# Patient Record
Sex: Male | Born: 1950
Health system: Southern US, Community
[De-identification: ages and names within clinical notes are randomized; demographics above are authoritative.]

## PROBLEM LIST (undated history)

## (undated) DIAGNOSIS — N200 Calculus of kidney: Secondary | ICD-10-CM

## (undated) DIAGNOSIS — K5792 Diverticulitis of intestine, part unspecified, without perforation or abscess without bleeding: Secondary | ICD-10-CM

## (undated) DIAGNOSIS — I10 Essential (primary) hypertension: Secondary | ICD-10-CM

## (undated) DIAGNOSIS — I2510 Atherosclerotic heart disease of native coronary artery without angina pectoris: Secondary | ICD-10-CM

## (undated) DIAGNOSIS — E785 Hyperlipidemia, unspecified: Secondary | ICD-10-CM

## (undated) HISTORY — DX: Hyperlipidemia, unspecified: E78.5

## (undated) HISTORY — DX: Diverticulitis of intestine, part unspecified, without perforation or abscess without bleeding: K57.92

## (undated) HISTORY — PX: APPENDECTOMY: SHX54

## (undated) HISTORY — DX: Calculus of kidney: N20.0

## (undated) HISTORY — DX: Atherosclerotic heart disease of native coronary artery without angina pectoris: I25.10

## (undated) HISTORY — DX: Essential (primary) hypertension: I10

---

## 2003-02-15 HISTORY — PX: OTHER SURGICAL HISTORY: SHX169

## 2003-05-16 ENCOUNTER — Ambulatory Visit (HOSPITAL_COMMUNITY): Admission: RE | Admit: 2003-05-16 | Discharge: 2003-05-16 | Payer: Self-pay | Admitting: Family Medicine

## 2003-08-04 ENCOUNTER — Ambulatory Visit (HOSPITAL_COMMUNITY): Admission: RE | Admit: 2003-08-04 | Discharge: 2003-08-04 | Payer: Self-pay | Admitting: Internal Medicine

## 2009-06-03 ENCOUNTER — Ambulatory Visit (HOSPITAL_COMMUNITY): Admission: RE | Admit: 2009-06-03 | Discharge: 2009-06-03 | Payer: Self-pay | Admitting: Family Medicine

## 2009-09-05 ENCOUNTER — Encounter: Payer: Self-pay | Admitting: Cardiovascular Disease

## 2009-12-25 ENCOUNTER — Encounter: Payer: Self-pay | Admitting: Cardiovascular Disease

## 2010-01-12 ENCOUNTER — Encounter: Payer: Self-pay | Admitting: Cardiovascular Disease

## 2010-03-27 ENCOUNTER — Encounter: Payer: Self-pay | Admitting: Cardiovascular Disease

## 2010-04-15 HISTORY — PX: DOBUTAMINE STRESS ECHO: SHX5426

## 2010-04-16 ENCOUNTER — Emergency Department (HOSPITAL_COMMUNITY): Payer: 59

## 2010-04-16 ENCOUNTER — Encounter: Payer: Self-pay | Admitting: Cardiovascular Disease

## 2010-04-16 ENCOUNTER — Emergency Department (HOSPITAL_COMMUNITY)
Admission: EM | Admit: 2010-04-16 | Discharge: 2010-04-16 | Disposition: A | Discharge status: Home / Self Care | Payer: 59 | Attending: Emergency Medicine | Admitting: Emergency Medicine

## 2010-04-16 DIAGNOSIS — N200 Calculus of kidney: Secondary | ICD-10-CM | POA: Insufficient documentation

## 2010-04-16 DIAGNOSIS — R109 Unspecified abdominal pain: Secondary | ICD-10-CM | POA: Insufficient documentation

## 2010-04-16 DIAGNOSIS — I10 Essential (primary) hypertension: Secondary | ICD-10-CM | POA: Insufficient documentation

## 2010-04-16 DIAGNOSIS — Z79899 Other long term (current) drug therapy: Secondary | ICD-10-CM | POA: Insufficient documentation

## 2010-04-16 DIAGNOSIS — R112 Nausea with vomiting, unspecified: Secondary | ICD-10-CM | POA: Insufficient documentation

## 2010-04-16 LAB — COMPREHENSIVE METABOLIC PANEL
ALT: 35 U/L (ref 0–53)
AST: 36 U/L (ref 0–37)
Albumin: 4.1 g/dL (ref 3.5–5.2)
Calcium: 9.6 mg/dL (ref 8.4–10.5)
Chloride: 102 mEq/L (ref 96–112)
GFR calc Af Amer: 51 mL/min — ABNORMAL LOW (ref >60)
GFR calc non Af Amer: 42 mL/min — ABNORMAL LOW (ref >60)
Glucose, Bld: 129 mg/dL — ABNORMAL HIGH (ref 70–99)
Potassium: 4.5 mEq/L (ref 3.5–5.1)
Total Bilirubin: 0.6 mg/dL (ref 0.3–1.2)

## 2010-04-16 LAB — URINALYSIS, ROUTINE W REFLEX MICROSCOPIC
Ketones, ur: NEGATIVE mg/dL
Nitrite: NEGATIVE
Specific Gravity, Urine: 1.025 (ref 1.005–1.030)
pH: 6 (ref 5.0–8.0)

## 2010-04-16 LAB — CBC
MCH: 32 pg (ref 26.0–34.0)
MCHC: 35.6 g/dL (ref 30.0–36.0)
Platelets: 131 10*3/uL — ABNORMAL LOW (ref 150–400)
RDW: 12.6 % (ref 11.5–15.5)

## 2010-04-16 LAB — DIFFERENTIAL
Eosinophils Relative: 0 % (ref 0–5)
Lymphocytes Relative: 8 % — ABNORMAL LOW (ref 12–46)
Monocytes Absolute: 0.3 10*3/uL (ref 0.1–1.0)
Neutro Abs: 8 10*3/uL — ABNORMAL HIGH (ref 1.7–7.7)
Neutrophils Relative %: 88 % — ABNORMAL HIGH (ref 43–77)

## 2010-04-16 LAB — URINE MICROSCOPIC-ADD ON

## 2010-04-21 ENCOUNTER — Encounter: Payer: Self-pay | Admitting: Cardiovascular Disease

## 2010-05-04 ENCOUNTER — Ambulatory Visit: Payer: 59 | Admitting: Cardiology

## 2010-05-06 ENCOUNTER — Encounter: Payer: Self-pay | Admitting: *Deleted

## 2010-05-06 ENCOUNTER — Encounter: Payer: Self-pay | Admitting: Cardiovascular Disease

## 2010-05-07 ENCOUNTER — Other Ambulatory Visit: Payer: Self-pay | Admitting: Cardiovascular Disease

## 2010-05-07 ENCOUNTER — Ambulatory Visit (INDEPENDENT_AMBULATORY_CARE_PROVIDER_SITE_OTHER): Payer: 59 | Admitting: Cardiovascular Disease

## 2010-05-07 ENCOUNTER — Encounter: Payer: Self-pay | Admitting: Cardiovascular Disease

## 2010-05-07 DIAGNOSIS — E785 Hyperlipidemia, unspecified: Secondary | ICD-10-CM

## 2010-05-07 DIAGNOSIS — I10 Essential (primary) hypertension: Secondary | ICD-10-CM

## 2010-05-07 DIAGNOSIS — R0600 Dyspnea, unspecified: Secondary | ICD-10-CM | POA: Insufficient documentation

## 2010-05-07 DIAGNOSIS — R0602 Shortness of breath: Secondary | ICD-10-CM

## 2010-05-07 NOTE — Patient Instructions (Addendum)
Stress Echo Follow up after test above.   Office will call with appointment.

## 2010-05-07 NOTE — Progress Notes (Signed)
HPI  This is a 60 y/o male without any prior cardiac history who was recently found to have coronary calcifications noted on a CT scan which was done for kidney stones. He has no chest pain but does complain of dyspnea with moderate activities. No palpitations, orthopnea or PND. No syncope or presyncope. He doesn't exercise regularly. He does have hyperlipidemia but didn't tolerate Simvastatin before due to myalgia and weakness.  He seems to have white coat hypertension as well as noted on his home BP readings vs. Office readings.   Allergies  Allergen Reactions  . Sulfa Drugs Cross Reactors     Swelling in groin area  . Simvastatin (Fd&C Red #40 [place]-Simvastatin)     myalgia     No current outpatient prescriptions on file prior to visit.     Past Medical History  Diagnosis Date  . Coronary atherosclerosis of native coronary artery   . Hyperlipidemia   . Allergic rhinitis   . Kidney stones   . Diverticulitis     occasional  . Hypertension     On meds, runs higher at MD office     Past Surgical History  Procedure Date  . Appendectomy      Family History  Problem Relation Age of Onset  . Diabetes Mother   . Aneurysm Mother 56    Arlys John     History   Social History  . Marital Status: Married    Spouse Name: N/A    Number of Children: 0  . Years of Education: N/A   Occupational History  . Machinist    Social History Main Topics  . Smoking status: Never Smoker   . Smokeless tobacco: Never Used  . Alcohol Use: Yes     Occasional Beer  . Drug Use: No  . Sexually Active: Not on file   Other Topics Concern  . Not on file   Social History Narrative  . No narrative on file     ROS Constitutional: Negative for fever, chills, diaphoresis, activity change, appetite change and fatigue.  HENT: Negative for hearing loss, nosebleeds, congestion, sore throat, facial swelling, drooling, trouble swallowing, neck pain, voice change, sinus pressure and tinnitus.    Eyes: Negative for photophobia, pain, discharge and visual disturbance.  Respiratory: Negative for apnea, cough, chest tightness, and wheezing. Positive for shortness of breath Cardiovascular: Negative for chest pain, palpitations and leg swelling.  Gastrointestinal: Negative for nausea, vomiting, abdominal pain, diarrhea, constipation, blood in stool and abdominal distention.  Genitourinary: Negative for dysuria, urgency, frequency, hematuria and decreased urine volume.  Musculoskeletal: Negative for myalgias, , joint swelling, arthralgias and gait problem. Positive for back pain Skin: Negative for color change, pallor, rash and wound.  Neurological: Negative for dizziness, tremors, seizures, syncope, speech difficulty, weakness, light-headedness, numbness and headaches.  Psychiatric/Behavioral: Negative for suicidal ideas, hallucinations, behavioral problems and agitation. The patient is not nervous/anxious.    PHYSICAL EXAM   BP 169/83  Pulse 91  Ht 5\' 8"  (1.727 m)  Wt 196 lb (88.905 kg)  BMI 29.80 kg/m2  Constitutional: He is oriented to person, place, and time. He appears well-developed and well-nourished. No distress.  HENT:  Head: Normocephalic and atraumatic.  Eyes: Pupils are equal, round, and reactive to light. Right eye exhibits no discharge. Left eye exhibits no discharge.  Neck: Normal range of motion. Neck supple. No JVD present. No thyromegaly present.  Cardiovascular: Normal rate, regular rhythm, normal heart sounds and intact distal pulses. Exam reveals no gallop  and no friction rub.  No murmur heard.  Pulmonary/Chest: Effort normal and breath sounds normal. No stridor. No respiratory distress. He has no wheezes. He has no rales. He exhibits no tenderness.  Abdominal: Soft. Bowel sounds are normal. He exhibits no distension. There is no tenderness. There is no rebound and no guarding.  Musculoskeletal: Normal range of motion. He exhibits no edema and no tenderness.   Neurological: He is alert and oriented to person, place, and time. Coordination normal.  Skin: Skin is warm and dry. No rash noted. He is not diaphoretic. No erythema. No pallor.  Psychiatric: He has a normal mood and affect. His behavior is normal. Judgment and thought content normal.     EKG: NSR. No significant ST or T wave changes.    ASSESSMENT AND PLAN

## 2010-05-07 NOTE — Assessment & Plan Note (Signed)
Recommend a gaol LDL of <100. Consider trying a different statin such as Crestor of Lipitor. Lifestyle changes were discussed.

## 2010-05-07 NOTE — Assessment & Plan Note (Signed)
He was found to have coronary calcifications on recent CT which indicates atherosclerosis. He has no chest pain but mild exertional dyspnea. Will need to rule out obstructive CAD. Will obtain a stress echo for evaluation.  Recommend aggressive treatment of risk factors especially hyperlipidemia.

## 2010-05-07 NOTE — Assessment & Plan Note (Signed)
Continue treatment with Lisinopril. Home BP is optimal.

## 2010-05-12 DIAGNOSIS — R072 Precordial pain: Secondary | ICD-10-CM

## 2010-05-13 NOTE — Letter (Signed)
Summary: External Correspondence/  [facility]  External Correspondence/  [facility]   Imported By: Dorise Hiss 05/03/2010 15:38:54  _____________________________________________________________________  External Attachment:    Type:   Image     Comment:   External Document

## 2010-05-13 NOTE — Medication Information (Signed)
Summary: RX Folder/ MED LIST [facility]  RX Folder/ MED LIST [facility]   Imported By: Dorise Hiss 05/03/2010 15:37:26  _____________________________________________________________________  External Attachment:    Type:   Image     Comment:   External Document

## 2010-05-17 ENCOUNTER — Encounter: Payer: Self-pay | Admitting: *Deleted

## 2010-05-18 NOTE — Telephone Encounter (Signed)
error    This encounter was created in error - please disregard.

## 2010-06-04 ENCOUNTER — Ambulatory Visit: Payer: 59 | Admitting: Cardiovascular Disease

## 2010-07-02 NOTE — Op Note (Signed)
NAME:  Joseph Rodgers, Joseph Rodgers                          ACCOUNT NO.:  [id number]   MEDICAL RECORD NO.:  [id number]                   PATIENT TYPE:  AMB   LOCATION:  DAY                                  FACILITY:  [facility]   PHYSICIAN:  R. Roetta Sessions, M.D.              DATE OF BIRTH:  [birth year]   DATE OF PROCEDURE:  08/04/2003  DATE OF DISCHARGE:                                 OPERATIVE REPORT   PROCEDURE:  Screening colonoscopy.   INDICATIONS FOR PROCEDURE:  The patient is a 60 year old gentleman kindly  referred at the courtesy of Dr. Lilyan Punt for colorectal cancer  screening.  He is devoid of any lower GI tract symptoms.  He has never had a  colonoscopy.  There is no family history of colorectal neoplasia.  Colonoscopy is being done as a standard screening maneuver.  This approach  has been discussed with the patient at length.  Potential risks, benefits,  and alternatives have been reviewed.  Please see my H&P for more  information.   PROCEDURE NOTE:  O2 saturations, blood pressure, pulse, and respirations  were monitored throughout the entire procedure.   CONSCIOUS SEDATION:  1. Versed 3 mg IV.  2. Demerol 75 mg IV.   INSTRUMENT:  The Olympus video chip system.   FINDINGS:  Digital rectal exam revealed no abnormalities.   ENDOSCOPIC FINDINGS:  His prep was good.   RECTUM:  Examination of rectal mucosa including retroflexed view of the anal  verge revealed no abnormalities.   COLON:  Colonic mucosa was surveyed from the rectosigmoid junction through  the left transverse and right colon to the area of the appendiceal orifice,  ileocecal valve, and cecum.  These structures were well-seen, photographed  for the record.  From this level, the scope was slowly withdrawn.  All  previously mentioned mucosal surfaces were again seen.  The terminal ileum  was intubated to 10 cm.  The colonic mucosa appeared normal aside from left-  sided transverse diverticula, and the terminal ileum  appeared normal.  The  patient tolerated the procedure well, was reacted in endoscopy.   IMPRESSION:  1. Normal rectum.  2. Left-sided transverse diverticulum.  Remainder of colonic mucosa appeared     normal.  3. Normal terminal ileum.   RECOMMENDATIONS:  1. Diverticulosis.  Literature provided to [name].  2. Repeat colonoscopy 10 years.      ___________________________________________                                            [name], M.D.   RMR/MEDQ  D:  08/04/2003  T:  08/04/2003  Job:  [id number]   cc:   Lorin Picket A. Gerda Diss, M.D.  [address]  [place]  [place] [address]  Fax: [phone]

## 2011-04-13 ENCOUNTER — Encounter: Payer: Self-pay | Admitting: Orthopedic Surgery

## 2011-04-13 ENCOUNTER — Ambulatory Visit (INDEPENDENT_AMBULATORY_CARE_PROVIDER_SITE_OTHER): Payer: 59 | Admitting: Orthopedic Surgery

## 2011-04-13 VITALS — BP 150/84 | Ht 68.0 in | Wt 200.0 lb

## 2011-04-13 DIAGNOSIS — IMO0002 Reserved for concepts with insufficient information to code with codable children: Secondary | ICD-10-CM

## 2011-04-13 DIAGNOSIS — M1710 Unilateral primary osteoarthritis, unspecified knee: Secondary | ICD-10-CM

## 2011-04-13 DIAGNOSIS — M23329 Other meniscus derangements, posterior horn of medial meniscus, unspecified knee: Secondary | ICD-10-CM

## 2011-04-13 DIAGNOSIS — M179 Osteoarthritis of knee, unspecified: Secondary | ICD-10-CM

## 2011-04-13 NOTE — Patient Instructions (Addendum)
You have received a steroid shot. 15% of patients experience increased pain at the injection site with in the next 24 hours. This is best treated with ice and tylenol extra strength 2 tabs every 8 hours. If you are still having pain please call the office.   Meniscus Tear with Phase I Rehab The meniscus is a C-shaped cartilage structure, located in the knee joint between the thigh bone (femur) and the shinbone (tibia). Two menisci are located in each knee joint: the inner and outer meniscus. The meniscus acts as an adapter between the thigh bone and shinbone, allowing them to fit properly together. It also functions as a shock absorber, to reduce the stress placed on the knee joint and to help supply nutrients to the knee joint cartilage. As people age, the meniscus begins to harden and become more vulnerable to injury. Meniscus tears are a common injury, especially in older athletes. Inner meniscus tears are more common than outer meniscus tears.   SYMPTOMS    Pain in the knee, especially with standing or squatting with the affected leg.     Tenderness along the joint line.     Swelling in the knee joint (effusion), usually starting 1 to 2 days after injury.     Locking or catching of the knee joint, causing inability to straighten the knee completely.     Giving way or buckling of the knee.  CAUSES   A meniscus tear occurs when a force is placed on the meniscus that is greater than it can handle. Common causes of injury include:  Direct hit (trauma) to the knee.     Twisting, pivoting, or cutting (rapidly changing direction while running), kneeling or squatting.     Without injury, due to aging.  RISK INCREASES WITH:  Contact sports (football, rugby).     Sports in which cleats are used with pivoting (soccer, lacrosse) or sports in which good shoe grip and sudden change in direction are required (racquetball, basketball, squash).     Previous knee injury.     Associated knee injury,  particularly ligament injuries.     Poor strength and flexibility.  PREVENTION  Warm up and stretch properly before activity.     Maintain physical fitness:     Strength, flexibility, and endurance.     Cardiovascular fitness.     Protect the knee with a brace or elastic bandage.     Wear properly fitted protective equipment (proper cleats for the surface).  PROGNOSIS   Sometimes, meniscus tears heal on their own. However, definitive treatment requires surgery, followed by at least 6 weeks of recovery.   RELATED COMPLICATIONS    Recurring symptoms that result in a chronic problem.     Repeated knee injury, especially if sports are resumed too soon after injury or surgery.     Progression of the tear (the tear gets larger), if untreated.     Arthritis of the knee in later years (with or without surgery).     Complications of surgery, including infection, bleeding, injury to nerves (numbness, weakness, paralysis) continued pain, giving way, locking, nonhealing of meniscus (if repaired), need for further surgery, and knee stiffness (loss of motion).  TREATMENT   Treatment first involves the use of ice and medicine, to reduce pain and inflammation. You may find using crutches to walk more comfortable. However, it is okay to bear weight on the injured knee, if the pain will allow it. Surgery is often advised as a definitive treatment.  Surgery is performed through an incision near the joint (arthroscopically). The torn piece of the meniscus is removed, and if possible the joint cartilage is repaired. After surgery, the joint must be restrained. After restraint, it is important to perform strengthening and stretching exercises to help regain strength and a full range of motion. These exercises may be completed at home or with a therapist.   MEDICATION  If pain medicine is needed, nonsteroidal anti-inflammatory medicines (aspirin and ibuprofen), or other minor pain relievers (acetaminophen),  are often advised.     Do not take pain medicine for 7 days before surgery.     Prescription pain relievers may be given, if your caregiver thinks they are needed. Use only as directed and only as much as you need.  HEAT AND COLD  Cold treatment (icing) should be applied for 10 to 15 minutes every 2 to 3 hours for inflammation and pain, and immediately after activity that aggravates your symptoms. Use ice packs or an ice massage.     Heat treatment may be used before performing stretching and strengthening activities prescribed by your caregiver, physical therapist, or athletic trainer. Use a heat pack or a warm water soak.  SEEK MEDICAL CARE IF:    Symptoms get worse or do not improve in 2 weeks, despite treatment.     New, unexplained symptoms develop. (Drugs used in treatment may produce side effects.)  EXERCISES RANGE OF MOTION (ROM) AND STRETCHING EXERCISES - Meniscus Tear, Non-operative, Phase I These are some of the initial exercises with which you may start your rehabilitation program, until you see your caregiver again or until your symptoms are resolved. Remember:    These initial exercises are intended to be gentle. They will help you restore motion without increasing any swelling.     Completing these exercises allows less painful movement and prepares you for the more aggressive strengthening exercises in Phase II.     An effective stretch should be held for at least 30 seconds.     A stretch should never be painful. You should only feel a gentle lengthening or release in the stretched tissue.  RANGE OF MOTION - Knee Flexion, Active  Lie on your back with both knees straight. (If this causes back discomfort, bend your healthy knee, placing your foot flat on the floor.)     Slowly slide your heel back toward your buttocks until you feel a gentle stretch in the front of your knee or thigh.     Hold for ____2______ seconds. Slowly slide your heel back to the starting  position.  Repeat _____15_____ times. Complete this exercise ___1_______ times per day.   RANGE OF MOTION - Knee Flexion and Extension, Active-Assisted  Sit on the edge of a table or chair with your thighs firmly supported. It may be helpful to place a folded towel under the end of your right / left thigh.     Flexion (bending): Place the ankle of your healthy leg on top of the other ankle. Use your healthy leg to gently bend your right / left knee until you feel a mild tension across the top of your knee.     Hold for ___2_______ seconds.     Extension (straightening): Switch your ankles so your right / left leg is on top. Use your healthy leg to straighten your right / left knee until you feel a mild tension on the backside of your knee.     Hold for ______2____ seconds.  Repeat  ___15_______ times. Complete ____1______ times per day. STRETCH - Knee Flexion, Supine  Lie on the floor with your right / left heel and foot lightly touching the wall. (Place both feet on the wall if you do not use a door frame.)     Without using any effort, allow gravity to slide your foot down the wall slowly until you feel a gentle stretch in the front of your right / left knee.     Hold this stretch for ____2______ seconds. Then return the leg to the starting position, using your healthy leg for help, if needed.  Repeat ______15____ times. Complete this stretch ____1______ times per day.   STRETCH - Knee Extension Sitting  Sit with your right / left leg/heel propped on another chair, coffee table, or foot stool.     Allow your leg muscles to relax, letting gravity straighten out your knee.*     You should feel a stretch behind your right / left knee. Hold this position for _____2_____ seconds.  Repeat __15________ times. Complete this stretch _____1_____ times per day.      STRENGTHENING EXERCISES - Meniscus Tear, Non-operative, Phase I These exercises may help you when beginning to rehabilitate your  injury. They may resolve your symptoms with or without further involvement from your physician, physical therapist or athletic trainer. While completing these exercises, remember:    Muscles can gain both the endurance and the strength needed for everyday activities through controlled exercises.     Complete these exercises as instructed by your physician, physical therapist or athletic trainer. Progress the resistance and repetitions only as guided.  STRENGTH - Quadriceps, Isometrics  Lie on your back with your right / left leg extended and your opposite knee bent.     Gradually tense the muscles in the front of your right / left thigh. You should see either your knee cap slide up toward your hip or increased dimpling just above the knee. This motion will push the back of the knee down toward the floor, mat, or bed on which you are lying.     Hold the muscle as tight as you can, without increasing your pain, for __2________ seconds.     Relax the muscles slowly and completely between each repetition.  Repeat ______15____ times. Complete this exercise _____1_____ times per day.    STRENGTH - Quadriceps, Short Arcs   Lie on your back. Place a ___6_______ inch towel roll under your right / left knee, so that the knee bends slightly.     Raise only your lower leg by tightening the muscles in the front of your thigh. Do not allow your thigh to rise.     Hold this position for ____2______ seconds.  Repeat ______15____ times. Complete this exercise ___1_______ times per day.    STRENGTH - Quadriceps, Straight Leg Raises  Quality counts! Watch for signs that the quadriceps muscle is working, to be sure you are strengthening the correct muscles and not "cheating" by substituting with healthier muscles.  Lay on your back with your right / left leg extended and your opposite knee bent.     Tense the muscles in the front of your right / left thigh. You should see either your knee cap slide up or  increased dimpling just above the knee. Your thigh may even shake a bit.     Tighten these muscles even more and raise your leg 4 to 6 inches off the floor. Hold for ____2______ seconds.  Keeping these muscles tense, lower your leg.     Relax the muscles slowly and completely in between each repetition.  Repeat _______15___ times. Complete this exercise _____1_____ times per day.    STRENGTH - Hamstring, Curls   Lay on your stomach with your legs extended. (If you lay on a bed, your feet may hang over the edge.)     Tighten the muscles in the back of your thigh to bend your right / left knee up to 90 degrees. Keep your hips flat on the bed.     Hold this position for __2________ seconds.     Slowly lower your leg back to the starting position.  Repeat _____15_____ times. Complete this exercise _____1_____ times per day.    STRENGTH - Quad/VMO, Isometric   Sit in a chair with your right / left knee slightly bent. With your fingertips, feel the VMO muscle just above the inside of your knee. The VMO is important in controlling the position of your kneecap.     Keeping your fingertips on this muscle. Without actually moving your leg, attempt to drive your knee down as if straightening your leg. You should feel your VMO tense. If you have a difficult time, you may wish to try the same exercise on your healthy knee first.     Tense this muscle as hard as you can without increasing any knee pain.     Hold for _____2_____ seconds. Relax the muscles slowly and completely in between each repetition.  Repeat _____15_____ times. Complete exercise _____1_____ times per day.   Document Released: 02/14/1998 Document Revised: 10/13/2010 Document Reviewed: 05/15/2008 [facility] Patient Information 2012 [place], [place].

## 2011-04-14 ENCOUNTER — Encounter: Payer: Self-pay | Admitting: Orthopedic Surgery

## 2011-04-14 DIAGNOSIS — M23329 Other meniscus derangements, posterior horn of medial meniscus, unspecified knee: Secondary | ICD-10-CM | POA: Insufficient documentation

## 2011-04-14 DIAGNOSIS — M1710 Unilateral primary osteoarthritis, unspecified knee: Secondary | ICD-10-CM | POA: Insufficient documentation

## 2011-04-14 NOTE — Progress Notes (Signed)
Patient ID: Joseph Rodgers, male   DOB: 1950-05-23, 61 y.o.   MRN: [id number] 3 views LEFT knee  LEFT knee x-ray report  Date 27 February, 2013  Alignment there is mild.  Medial joint space narrowing mild to moderate.  Minimal to no surrounding osteophytes.  Mild subchondral sclerosis.  No cyst formation.  No joint effusion.  Impression mild OA LEFT knee. Subjective:    Joseph Rodgers is a 61 y.o. male who presents LEFT knee pain since June of 2011.  The patient sat or squatted down got up and started having medial knee pain.  Since that time the sagittal sharp occasionally burning and medial knee pain which comes and goes depending on activity and is associated with swelling.  He denies locking or catching.  He wears a knee sleeve and has taken some nabumetone but his symptoms have not resolved.   The following portions of the patient's history were reviewed and updated as appropriate: allergies, current medications, past family history, past medical history, past social history, past surgical history and problem list.   Review of Systems A comprehensive review of systems was negative except for: snoring her seasonal ALLERGIES stiffness in the knee and muscle pain around the knee joint   Objective:    BP 150/84  Ht 5\' 8"  (1.727 m)  Wt 90.719 kg (200 lb)  BMI 30.41 kg/m2  Vital signs are stable as recorded  General appearance is normal  The patient is alert and oriented x3  The patient's mood and affect are normal  Gait assessment: normal  The cardiovascular exam reveals normal pulses and temperature without edema swelling.  The lymphatic system is negative for palpable lymph nodes  The sensory exam is normal.  There are no pathologic reflexes.  Balance is normal.  Upper extremity exam  Inspection and palpation revealed no abnormalities in the upper extremities.  Range of motion is full without contracture.  Motor exam is normal with grade 5 strength.  The joints  are fully reduced without subluxation.  There is no atrophy or tremor and muscle tone is normal.  All joints are stable.      Right knee: normal and no effusion, full active range of motion, no joint line tenderness, ligamentous structures intact.  Left knee:  multiple aspects of the knee are that the knee is in varus position.  The joint line medially he is tender severe tenderness is noted.  There is no joint effusion.  Range of motion is normal.  Collateral ligaments are stable.  Cruciate ligaments are stable.  Strength assessment is normal to manual muscle testing.  Skin is intact.  Cardiovascular exam pulses and temperature are normal no edema no swelling.  Lymph nodes are normal and sensation is intact.   X-ray left knee: shows DJD changes, likely chronic    Assessment:    Left Moderate osteoarthritis on the left    Plan:    Natural history and expected course discussed. Questions answered. Quad strengthening exercises. NSAIDs per medication orders. Arthrocentesis. See procedure note.

## 2011-05-11 ENCOUNTER — Encounter: Payer: Self-pay | Admitting: Orthopedic Surgery

## 2011-05-11 ENCOUNTER — Ambulatory Visit (INDEPENDENT_AMBULATORY_CARE_PROVIDER_SITE_OTHER): Payer: 59 | Admitting: Orthopedic Surgery

## 2011-05-11 VITALS — BP 120/70 | Ht 68.0 in | Wt 200.0 lb

## 2011-05-11 DIAGNOSIS — M1710 Unilateral primary osteoarthritis, unspecified knee: Secondary | ICD-10-CM

## 2011-05-11 DIAGNOSIS — M23329 Other meniscus derangements, posterior horn of medial meniscus, unspecified knee: Secondary | ICD-10-CM

## 2011-05-11 NOTE — Patient Instructions (Signed)
CALL BACK IF PAIN INCREASES

## 2011-05-11 NOTE — Progress Notes (Signed)
Patient ID: Joseph Rodgers, male   DOB: May 31, 1950, 61 y.o.   MRN: [id number] Chief Complaint  Patient presents with  . Follow-up    one month recheck left knee     Recheck LEFT knee  Diagnosis is osteoarthritis and probable meniscal tear  Treated with injection home exercises bracing.  X-ray showed a arthritic knee.  The patient says he is 85% better  Review of systems catching sensation but no actual catching locking or giving way  Ambulation is normal without assistive device no tenderness or swelling, range of motion is full.  Strength is normal knee is stable McMurray sign is negative neurovascular exam intact  Impression osteoarthritis with probable meniscal tear doing well continue nonoperative treatment followup as needed

## 2012-04-21 ENCOUNTER — Encounter: Payer: Self-pay | Admitting: *Deleted

## 2012-09-12 ENCOUNTER — Telehealth: Payer: Self-pay | Admitting: Family Medicine

## 2012-09-12 DIAGNOSIS — Z79899 Other long term (current) drug therapy: Secondary | ICD-10-CM

## 2012-09-12 DIAGNOSIS — Z125 Encounter for screening for malignant neoplasm of prostate: Secondary | ICD-10-CM

## 2012-09-12 DIAGNOSIS — I10 Essential (primary) hypertension: Secondary | ICD-10-CM

## 2012-09-12 DIAGNOSIS — E782 Mixed hyperlipidemia: Secondary | ICD-10-CM

## 2012-09-13 NOTE — Telephone Encounter (Signed)
Met 7, PSA, Lipid, Liver, Urine Micro per Dr. Lorin Picket

## 2012-09-14 NOTE — Telephone Encounter (Signed)
Blood work papers printed and left up front for patient pick up. Patient notified. 

## 2012-09-22 LAB — HEPATIC FUNCTION PANEL
ALT: 27 U/L (ref 0–53)
Albumin: 4.3 g/dL (ref 3.5–5.2)
Alkaline Phosphatase: 63 U/L (ref 39–117)
Bilirubin, Direct: 0.1 mg/dL (ref 0.0–0.3)
Indirect Bilirubin: 0.7 mg/dL (ref 0.0–0.9)

## 2012-09-22 LAB — LIPID PANEL: Cholesterol: 230 mg/dL — ABNORMAL HIGH (ref 0–200)

## 2012-09-22 LAB — MICROALBUMIN, URINE: Microalb, Ur: 0.5 mg/dL (ref 0.00–1.89)

## 2012-09-22 LAB — BASIC METABOLIC PANEL: Potassium: 4.4 mEq/L (ref 3.5–5.3)

## 2012-09-22 LAB — PSA: PSA: 1.24 ng/mL (ref <=4.00)

## 2012-09-26 ENCOUNTER — Encounter: Payer: Self-pay | Admitting: Family Medicine

## 2012-10-01 ENCOUNTER — Ambulatory Visit (INDEPENDENT_AMBULATORY_CARE_PROVIDER_SITE_OTHER): Payer: 59 | Admitting: Family Medicine

## 2012-10-01 ENCOUNTER — Ambulatory Visit (HOSPITAL_COMMUNITY)
Admission: RE | Admit: 2012-10-01 | Discharge: 2012-10-01 | Disposition: A | Discharge status: Home / Self Care | Payer: 59 | Source: Ambulatory Visit | Attending: Family Medicine | Admitting: Family Medicine

## 2012-10-01 ENCOUNTER — Encounter: Payer: Self-pay | Admitting: Family Medicine

## 2012-10-01 VITALS — BP 148/88 | HR 70 | Ht 65.75 in | Wt 199.8 lb

## 2012-10-01 DIAGNOSIS — M79671 Pain in right foot: Secondary | ICD-10-CM

## 2012-10-01 DIAGNOSIS — Z0000 Encounter for general adult medical examination without abnormal findings: Secondary | ICD-10-CM

## 2012-10-01 DIAGNOSIS — M79609 Pain in unspecified limb: Secondary | ICD-10-CM

## 2012-10-01 DIAGNOSIS — Z23 Encounter for immunization: Secondary | ICD-10-CM

## 2012-10-01 DIAGNOSIS — M19049 Primary osteoarthritis, unspecified hand: Secondary | ICD-10-CM | POA: Insufficient documentation

## 2012-10-01 MED ORDER — LISINOPRIL 10 MG PO TABS: 10.0000 mg | ORAL_TABLET | Freq: Every day | ORAL | Status: DC

## 2012-10-01 NOTE — Patient Instructions (Signed)
DASH Diet The DASH diet stands for "Dietary Approaches to Stop Hypertension." It is a healthy eating plan that has been shown to reduce high blood pressure (hypertension) in as little as 14 days, while also possibly providing other significant health benefits. These other health benefits include reducing the risk of breast cancer after menopause and reducing the risk of type 2 diabetes, heart disease, colon cancer, and stroke. Health benefits also include weight loss and slowing kidney failure in patients with chronic kidney disease.  DIET GUIDELINES  Limit salt (sodium). Your diet should contain less than 1500 mg of sodium daily.  Limit refined or processed carbohydrates. Your diet should include mostly whole grains. Desserts and added sugars should be used sparingly.  Include small amounts of heart-healthy fats. These types of fats include nuts, oils, and tub margarine. Limit saturated and trans fats. These fats have been shown to be harmful in the body. CHOOSING FOODS  The following food groups are based on a 2000 calorie diet. See your Registered Dietitian for individual calorie needs. Grains and Grain Products (6 to 8 servings daily)  Eat More Often: Whole-wheat bread, brown rice, whole-grain or wheat pasta, quinoa, popcorn without added fat or salt (air popped).  Eat Less Often: White bread, white pasta, white rice, cornbread. Vegetables (4 to 5 servings daily)  Eat More Often: Fresh, frozen, and canned vegetables. Vegetables may be raw, steamed, roasted, or grilled with a minimal amount of fat.  Eat Less Often/Avoid: Creamed or fried vegetables. Vegetables in a cheese sauce. Fruit (4 to 5 servings daily)  Eat More Often: All fresh, canned (in natural juice), or frozen fruits. Dried fruits without added sugar. One hundred percent fruit juice ( cup [237 mL] daily).  Eat Less Often: Dried fruits with added sugar. Canned fruit in light or heavy syrup. [employer], Fish, and Poultry (2  servings or less daily. One serving is 3 to 4 oz [85-114 g]).  Eat More Often: Ninety percent or leaner ground beef, tenderloin, sirloin. Round cuts of beef, chicken breast, [place] breast. All fish. Grill, bake, or broil your meat. Nothing should be fried.  Eat Less Often/Avoid: Fatty cuts of meat, [place], or chicken leg, thigh, or wing. Fried cuts of meat or fish. Dairy (2 to 3 servings)  Eat More Often: Low-fat or fat-free milk, low-fat plain or light yogurt, reduced-fat or part-skim cheese.  Eat Less Often/Avoid: Milk (whole, 2%).Whole milk yogurt. Full-fat cheeses. Nuts, Seeds, and Legumes (4 to 5 servings per week)  Eat More Often: All without added salt.  Eat Less Often/Avoid: Salted nuts and seeds, canned beans with added salt. Fats and Sweets (limited)  Eat More Often: Vegetable oils, tub margarines without trans fats, sugar-free gelatin. Mayonnaise and salad dressings.  Eat Less Often/Avoid: Coconut oils, palm oils, butter, stick margarine, cream, half and half, cookies, candy, pie. FOR MORE INFORMATION The Dash Diet Eating Plan: www.dashdiet.org Document Released: 01/20/2011 Document Revised: 04/25/2011 Document Reviewed: 01/20/2011 [facility] Patient Information 2014 [place], [place].   Red rice yeast extract daily   Recheck labs with office visit in 3 months

## 2012-10-01 NOTE — Progress Notes (Signed)
Subjective:    Patient ID: Joseph Rodgers, male    DOB: 09/17/1950, 62 y.o.   MRN: [id number]  HPI Here for a wellness exam. Concerns about a spot on his back that is itching. Concerns about right foot pain x couple of months.great toe region,aches at the MTP. Concerns about headaches. - none at night. No nausea. No double vision. Happens 1 per week. Been present for a few months.stressful job seperated from wife/ blue at times/ not suicidal The patient comes in today for a wellness visit.  A review of their health history was completed.  A review of medications was also completed. Any necessary refills were discussed. Sensible healthy diet was discussed. Importance of minimizing excessive salt and carbohydrates was also discussed. Safety was stressed including driving, activities at work and at home where applicable. Importance of regular physical activity for overall health was discussed. Preventative measures appropriate for age were discussed. Time was spent with the patient discussing any concerns they have about their well-being.     Review of Systems  Constitutional: Negative for fever, activity change and appetite change.  HENT: Negative for congestion, rhinorrhea and neck pain.   Eyes: Negative for discharge.  Respiratory: Negative for cough and wheezing.   Cardiovascular: Negative for chest pain.  Gastrointestinal: Negative for vomiting, abdominal pain and blood in stool.  Genitourinary: Negative for frequency and difficulty urinating.  Musculoskeletal:       Patient does relate pain and discomfort in the right foot in the first mtp, he relates a sharp pain aching stiffness. Progressive for the past several months.  Skin: Negative for rash.  Allergic/Immunologic: Negative for environmental allergies and food allergies.  Neurological: Negative for weakness and headaches.       Patient does relate intermittent sharp pains at the back of his head on the right side last for  a few minutes at a time and goes away does not wake him up does not cause blurred or double vision. No vomiting with it. No fevers. Has been going off and on for a few months not severe.  Psychiatric/Behavioral: Negative for agitation.   He denies any rectal bleeding.   denies hematuria Objective:   Physical Exam  Constitutional: He appears well-developed and well-nourished.  HENT:  Head: Normocephalic and atraumatic.  Right Ear: External ear normal.  Left Ear: External ear normal.  Nose: Nose normal.  Mouth/Throat: Oropharynx is clear and moist.  Eyes: EOM are normal. Pupils are equal, round, and reactive to light.  Neck: Normal range of motion. Neck supple. No thyromegaly present.  Cardiovascular: Normal rate, regular rhythm and normal heart sounds.   No murmur heard. Blood pressure was slightly elevated initially but on recheck 138/84  Pulmonary/Chest: Effort normal and breath sounds normal. No respiratory distress. He has no wheezes.  Abdominal: Soft. Bowel sounds are normal. He exhibits no distension and no mass. There is no tenderness.  Genitourinary: Penis normal.  Musculoskeletal: Normal range of motion. He exhibits no edema.  Lymphadenopathy:    He has no cervical adenopathy.  Neurological: He is alert. He exhibits normal muscle tone.  Skin: Skin is warm and dry. No erythema.  He has multiple seborrheic keratosis on his back and legs. There is no signs of any skin cancer going on  Psychiatric: He has a normal mood and affect. His behavior is normal. Judgment normal.          Assessment & Plan:  #1 wellness-he was advised to exercise watching diet trying to  lose weight #2 persistent hyperlipidemia did not tolerate statins he was encouraged to try red rice yeast extract #3 hypertension continue medication low salt diet exercise. #4 occasional headaches seems musculoskeletal. If worsens or causes high fevers severe headaches or worse followup #5 seborrheic keratosis no  action needed reassurance given #6 foot pain arthritis versus gout appropriate tests ordered Patient encouraged followup in 6 months

## 2012-10-01 NOTE — Addendum Note (Signed)
Addended by: Lilyan Punt A on: 10/01/2012 01:13 PM   Modules accepted: Orders

## 2012-10-03 ENCOUNTER — Telehealth: Payer: Self-pay | Admitting: Family Medicine

## 2012-10-03 ENCOUNTER — Encounter: Payer: Self-pay | Admitting: Family Medicine

## 2012-10-03 ENCOUNTER — Encounter: Payer: Self-pay | Admitting: *Deleted

## 2012-10-03 NOTE — Telephone Encounter (Signed)
Was seen on 10/01/2012 by Dr. Lilyan Punt.  Patient called to state that on his After Visit Summary it reads that he has smoked in the past.  He wants this taken off of his Medical Records as it may have an adverse effect on his future treat, insurance, etc.......Marland Kitchen

## 2012-10-03 NOTE — Telephone Encounter (Signed)
Removed from record.

## 2012-10-04 ENCOUNTER — Encounter: Payer: Self-pay | Admitting: *Deleted

## 2012-10-04 ENCOUNTER — Other Ambulatory Visit: Payer: Self-pay | Admitting: *Deleted

## 2012-10-04 DIAGNOSIS — M109 Gout, unspecified: Secondary | ICD-10-CM

## 2012-10-04 MED ORDER — COLCHICINE 0.6 MG PO TABS: 0.6000 mg | ORAL_TABLET | Freq: Every day | ORAL | Status: DC

## 2012-10-04 MED ORDER — ALLOPURINOL 100 MG PO TABS: 100.0000 mg | ORAL_TABLET | Freq: Every day | ORAL | Status: DC

## 2012-11-13 ENCOUNTER — Telehealth: Payer: Self-pay | Admitting: Family Medicine

## 2012-11-13 LAB — URIC ACID: Uric Acid, Serum: 7.5 mg/dL (ref 4.0–7.8)

## 2012-11-13 NOTE — Telephone Encounter (Signed)
Patient called today to make a late afternoon appointment to go over blood work he had completed this week.  Offered him a 2:30pm appointment on November 20, 2012, He informed me that was not late and to see what I had for the next week.  Patient was then offered a 3:00pm on November 28, 2012.  He then asked what happen to the 4:30pm appointments as that was a late appointment.  I informed him that those are now for Same Day sick visits.  He then proceeded to tell me that was ridiculous as Dr. Lorin Picket informed him to wait 6 to 8 weeks to have blood work completed then make an appointment to go over this blood work.  Patient also stated that Dr. Lorin Picket knows he works and can not get off work before 3:30pm to come to an appointment.  He did not make an appointment at this time as he states he didn't know how to handle this situation.  I did tell him I was sorry and I would be happy to schedule one of these appointments for him.

## 2012-11-13 NOTE — Telephone Encounter (Signed)
The reality of the situation is late afternoon appointments 4 PM it is as late as we go on some days. These are appointments that are in high demand schedule quite far in advance. As inconvenient as it is the majority of our appointments are during the day. I suppose if we were a much larger clinic we could offer evening appointments to every body but are late afternoon and early evenings are often set aside for urgent care matters. So in all due respect we are doing as best as we can. As you are well aware most people just have to take off from work early.

## 2012-11-15 ENCOUNTER — Encounter: Payer: Self-pay | Admitting: Family Medicine

## 2012-11-16 MED ORDER — ALLOPURINOL 100 MG PO TABS: 100.0000 mg | ORAL_TABLET | Freq: Every day | ORAL | Status: DC

## 2012-11-16 NOTE — Addendum Note (Signed)
Addended by: Margaretha Sheffield on: 11/16/2012 10:44 AM   Modules accepted: Orders

## 2012-12-03 ENCOUNTER — Telehealth: Payer: Self-pay | Admitting: Family Medicine

## 2012-12-03 DIAGNOSIS — E785 Hyperlipidemia, unspecified: Secondary | ICD-10-CM

## 2012-12-03 DIAGNOSIS — Z79899 Other long term (current) drug therapy: Secondary | ICD-10-CM

## 2012-12-03 DIAGNOSIS — M109 Gout, unspecified: Secondary | ICD-10-CM

## 2012-12-03 DIAGNOSIS — Z125 Encounter for screening for malignant neoplasm of prostate: Secondary | ICD-10-CM

## 2012-12-03 NOTE — Telephone Encounter (Signed)
Needs to have blood work completed before visit with Dr. Lorin Picket on 12/18/2012.  Please call when patient can go to the lab for this test.

## 2012-12-03 NOTE — Addendum Note (Signed)
Addended by: Dereck Ligas on: 12/03/2012 03:48 PM   Modules accepted: Orders

## 2012-12-03 NOTE — Telephone Encounter (Signed)
Notified patient that blood work was ordered and can go to [facility] to have it drawn. Patient verbalized understanding.

## 2012-12-12 ENCOUNTER — Encounter: Payer: Self-pay | Admitting: Family Medicine

## 2012-12-12 LAB — BASIC METABOLIC PANEL
BUN: 15 mg/dL (ref 6–23)
Calcium: 9.7 mg/dL (ref 8.4–10.5)
Glucose, Bld: 76 mg/dL (ref 70–99)
Potassium: 4.5 mEq/L (ref 3.5–5.3)

## 2012-12-12 LAB — URIC ACID: Uric Acid, Serum: 6.7 mg/dL (ref 4.0–7.8)

## 2012-12-18 ENCOUNTER — Encounter: Payer: Self-pay | Admitting: Family Medicine

## 2012-12-18 ENCOUNTER — Ambulatory Visit (INDEPENDENT_AMBULATORY_CARE_PROVIDER_SITE_OTHER): Payer: 59 | Admitting: Family Medicine

## 2012-12-18 VITALS — BP 140/90 | Ht 67.0 in | Wt 208.0 lb

## 2012-12-18 DIAGNOSIS — I10 Essential (primary) hypertension: Secondary | ICD-10-CM

## 2012-12-18 MED ORDER — ALLOPURINOL 100 MG PO TABS: 100.0000 mg | ORAL_TABLET | Freq: Every day | ORAL | Status: DC

## 2012-12-18 MED ORDER — AZITHROMYCIN 250 MG PO TABS: ORAL_TABLET | ORAL | Status: AC

## 2012-12-18 NOTE — Patient Instructions (Addendum)
Gout Gout is an inflammatory condition (arthritis) caused by a buildup of uric acid crystals in the joints. Uric acid is a chemical that is normally present in the blood. Under some circumstances, uric acid can form into crystals in your joints. This causes joint redness, soreness, and swelling (inflammation). Repeat attacks are common. Over time, uric acid crystals can form into masses (tophi) near a joint, causing disfigurement. Gout is treatable and often preventable. CAUSES  The disease begins with elevated levels of uric acid in the blood. Uric acid is produced by your body when it breaks down a naturally found substance called purines. This also happens when you eat certain foods such as meats and fish. Causes of an elevated uric acid level include:  Being passed down from parent to child (heredity).  Diseases that cause increased uric acid production (obesity, psoriasis, some cancers).  Excessive alcohol use.  Diet, especially diets rich in meat and seafood.  Medicines, including certain cancer-fighting drugs (chemotherapy), diuretics, and aspirin.  Chronic kidney disease. The kidneys are no longer able to remove uric acid well.  Problems with metabolism. Conditions strongly associated with gout include:  Obesity.  High blood pressure.  High cholesterol.  Diabetes. Not everyone with elevated uric acid levels gets gout. It is not understood why some people get gout and others do not. Surgery, joint injury, and eating too much of certain foods are some of the factors that can lead to gout. SYMPTOMS   An attack of gout comes on quickly. It causes intense pain with redness, swelling, and warmth in a joint.  Fever can occur.  Often, only one joint is involved. Certain joints are more commonly involved:  Base of the big toe.  Knee.  Ankle.  Wrist.  Finger. Without treatment, an attack usually goes away in a few days to weeks. Between attacks, you usually will not have  symptoms, which is different from many other forms of arthritis. DIAGNOSIS  Your caregiver will suspect gout based on your symptoms and exam. Removal of fluid from the joint (arthrocentesis) is done to check for uric acid crystals. Your caregiver will give you a medicine that numbs the area (local anesthetic) and use a needle to remove joint fluid for exam. Gout is confirmed when uric acid crystals are seen in joint fluid, using a special microscope. Sometimes, blood, urine, and X-ray tests are also used. TREATMENT  There are 2 phases to gout treatment: treating the sudden onset (acute) attack and preventing attacks (prophylaxis). Treatment of an Acute Attack  Medicines are used. These include anti-inflammatory medicines or steroid medicines.  An injection of steroid medicine into the affected joint is sometimes necessary.  The painful joint is rested. Movement can worsen the arthritis.  You may use warm or cold treatments on painful joints, depending which works best for you.  Discuss the use of coffee, vitamin C, or cherries with your caregiver. These may be helpful treatment options. Treatment to Prevent Attacks After the acute attack subsides, your caregiver may advise prophylactic medicine. These medicines either help your kidneys eliminate uric acid from your body or decrease your uric acid production. You may need to stay on these medicines for a very long time. The early phase of treatment with prophylactic medicine can be associated with an increase in acute gout attacks. For this reason, during the first few months of treatment, your caregiver may also advise you to take medicines usually used for acute gout treatment. Be sure you understand your caregiver's directions.   You should also discuss dietary treatment with your caregiver. Certain foods such as meats and fish can increase uric acid levels. Other foods such as dairy can decrease levels. Your caregiver can give you a list of foods  to avoid. HOME CARE INSTRUCTIONS   Do not take aspirin to relieve pain. This raises uric acid levels.  Only take over-the-counter or prescription medicines for pain, discomfort, or fever as directed by your caregiver.  Rest the joint as much as possible. When in bed, keep sheets and blankets off painful areas.  Keep the affected joint raised (elevated).  Use crutches if the painful joint is in your leg.  Drink enough water and fluids to keep your urine clear or pale yellow. This helps your body get rid of uric acid. Do not drink alcoholic beverages. They slow the passage of uric acid.  Follow your caregiver's dietary instructions. Pay careful attention to the amount of protein you eat. Your daily diet should emphasize fruits, vegetables, whole grains, and fat-free or low-fat milk products.  Maintain a healthy body weight. SEEK MEDICAL CARE IF:   You have an oral temperature above 102 F (38.9 C).  You develop diarrhea, vomiting, or any side effects from medicines.  You do not feel better in 24 hours, or you are getting worse. SEEK IMMEDIATE MEDICAL CARE IF:   Your joint becomes suddenly more tender and you have:  Chills.  An oral temperature above 102 F (38.9 C), not controlled by medicine. MAKE SURE YOU:   Understand these instructions.  Will watch your condition.  Will get help right away if you are not doing well or get worse. Document Released: 01/29/2000 Document Revised: 04/25/2011 Document Reviewed: 05/11/2009 [facility] Patient Information 2014 [place], [place]. DASH Diet The DASH diet stands for "Dietary Approaches to Stop Hypertension." It is a healthy eating plan that has been shown to reduce high blood pressure (hypertension) in as little as 14 days, while also possibly providing other significant health benefits. These other health benefits include reducing the risk of breast cancer after menopause and reducing the risk of type 2 diabetes, heart disease, colon  cancer, and stroke. Health benefits also include weight loss and slowing kidney failure in patients with chronic kidney disease.  DIET GUIDELINES  Limit salt (sodium). Your diet should contain less than 1500 mg of sodium daily.  Limit refined or processed carbohydrates. Your diet should include mostly whole grains. Desserts and added sugars should be used sparingly.  Include small amounts of heart-healthy fats. These types of fats include nuts, oils, and tub margarine. Limit saturated and trans fats. These fats have been shown to be harmful in the body. CHOOSING FOODS  The following food groups are based on a 2000 calorie diet. See your Registered Dietitian for individual calorie needs. Grains and Grain Products (6 to 8 servings daily)  Eat More Often: Whole-wheat bread, brown rice, whole-grain or wheat pasta, quinoa, popcorn without added fat or salt (air popped).  Eat Less Often: White bread, white pasta, white rice, cornbread. Vegetables (4 to 5 servings daily)  Eat More Often: Fresh, frozen, and canned vegetables. Vegetables may be raw, steamed, roasted, or grilled with a minimal amount of fat.  Eat Less Often/Avoid: Creamed or fried vegetables. Vegetables in a cheese sauce. Fruit (4 to 5 servings daily)  Eat More Often: All fresh, canned (in natural juice), or frozen fruits. Dried fruits without added sugar. One hundred percent fruit juice ( cup [237 mL] daily).  Eat Less Often: Dried  fruits with added sugar. Canned fruit in light or heavy syrup. [employer], Fish, and Poultry (2 servings or less daily. One serving is 3 to 4 oz [85-114 g]).  Eat More Often: Ninety percent or leaner ground beef, tenderloin, sirloin. Round cuts of beef, chicken breast, [place] breast. All fish. Grill, bake, or broil your meat. Nothing should be fried.  Eat Less Often/Avoid: Fatty cuts of meat, [place], or chicken leg, thigh, or wing. Fried cuts of meat or fish. Dairy (2 to 3 servings)  Eat More  Often: Low-fat or fat-free milk, low-fat plain or light yogurt, reduced-fat or part-skim cheese.  Eat Less Often/Avoid: Milk (whole, 2%).Whole milk yogurt. Full-fat cheeses. Nuts, Seeds, and Legumes (4 to 5 servings per week)  Eat More Often: All without added salt.  Eat Less Often/Avoid: Salted nuts and seeds, canned beans with added salt. Fats and Sweets (limited)  Eat More Often: Vegetable oils, tub margarines without trans fats, sugar-free gelatin. Mayonnaise and salad dressings.  Eat Less Often/Avoid: Coconut oils, palm oils, butter, stick margarine, cream, half and half, cookies, candy, pie. FOR MORE INFORMATION The Dash Diet Eating Plan: www.dashdiet.org Document Released: 01/20/2011 Document Revised: 04/25/2011 Document Reviewed: 01/20/2011 [facility] Patient Information 2014 [place], [place].   Uric Acid Nephropathy ( you don't have this currently) Uric acid nephropathy is a condition of kidney damage that occurs when there is too much uric acid in the body. When the uric acid is too high, crystals can form in the tissues of the body. When this happens in the kidneys, it is called gouty or uric acid nephropathy.  Uric acid comes from the breakdown of purines in your diet. If too much uric acid is made, or your body does not efficiently clear uric acid from your body, then this can lead to too much uric acid in your blood. The whole system can also get very overworked. In those cases, kidney damage occurs; but the disease does not always cause high uric acid levels in the blood. The disease does not always cause high uric acid levels in the blood. CAUSES  Most cases of uric acid nephropathy happen during the treatment of cancer. This is because of the increase in uric acid production during chemotherapy.  Uric Acid Nephropathy happens frequently in people with gout. Some more common causes of high uric acid in the body include:  Overproduction of urate  Breakdown of  blood.  Lymphomas and leukemias.  Polycythemia vera.  Psoriasis (severe).  Paget's disease.  Muscle breakdown.  Exercise.  Alcohol.  Obesity.  Purine-rich diet.  Decreased excretion of uric acid  Primary idiopathic hyperuricemia.  Kidney disease.  Diabetes insipidus.  High blood pressure.  Acidosis.  Down syndrome.  Starvation.  Sarcoidosis.  Lead intake.  Hyperparathyroidism.  Hypothyroidism.  Toxemia of Pregnancy.  Drug ingestion.  Aspirin (less than 2 g per day).  Water pills (diuretics).  Alcohol.  Sinemet.  Nicotinic acid.  Combined mechanism.  Glucose-6-phosphate dehydrogenase deficiency.  Alcohol. SYMPTOMS When uric acid nephropathy leads to sudden kidney failure, symptoms can include:  Nausea and vomiting.  Lethargy and seizures.  Complete kidney failure with the inability to pass your water or urinating less. If stones form in the kidneys or ureters, some of the problems may include:  Flank pain.  Blood in the urine.  Urinary frequency.  Uncomfortable or painful urination. DIAGNOSIS  The diagnosis is based on finding uric acid crystals in joints, tissues or body fluids.  Blood work is done to check on  the uric acid levels but is not always abnormal. Kidney function tests and other blood work is also done.  A urinalysis is usually performed and will show if uric acid crystals are found in the urine. The urine sample is usually normal. TREATMENT  The treatment is aimed at stopping the immediate problems and preventing them from coming back. It is also for the prevention of complications which come from longstanding gout or high uric acid in the tissues.  This is often done with medications.  If the start of kidney failure has been sudden and more severe, dialysis may be used.  Your caregiver may have you take a special diet which is low in purines. Just cutting out animal meats is a great help to cut down on your  purine intake.  Keep up a good fluid intake. You should drink enough water to put out 2 to 3 quarts or liters of urine per day. PREVENTION  Prevention is done by using medications which decrease uric acid production or with medications to increase the rate of removal by the kidneys.  Obesity, high purine diets, alcohol, certain medications and too much exercise can elevate the uric acid. SEEK IMMEDIATE MEDICAL CARE IF:  You have been put on allopurinol and develop a rash. This is a medical emergency.  You develop flank pain, urinate less or stop urinating.  Your urine becomes smoky or bloody colored.  You develop nausea or vomiting.  You develop lethargy or seizures. Document Released: 11/28/2006 Document Revised: 04/25/2011 Document Reviewed: 11/28/2006 [facility] Patient Information 2014 [place], [place].

## 2012-12-18 NOTE — Progress Notes (Signed)
  Subjective:    Patient ID: Joseph Rodgers, male    DOB: 11/25/1950, 62 y.o.   MRN: [id number]  HPI Patient is here for follow up visit for gout. He had blood work completed and would like to know the results of this. He states he has no other concerns at this time.  Long discussion held regarding gout also regarding hypertension in interplay of these 2+ also how uncontrolled gout can cause gout nephropathy. Patient voiced understanding. He does not want to be on gout medicine for the rest of his life he states he is going to try to eat healthier. History HTN gout Patient stays by himself so he does not always eat a healthy [place]. He does not smoke. Family history noncontributory Review of Systems Lungs are clear he denies shortness of breath chest pain discomfort joint pains abdominal pain vomiting diarrhea    Objective:   Physical Exam Lungs are clear hearts regular pulse normal extremities no edema blood pressure recheck twice near 140/90. Patient states his blood pressure readings at home are actually very good.       Assessment & Plan:  Needs colonoscopy in 2015 Gout continue medication but patient states he is going try the healthy her he'll recheck uric acid level about a week before he comes in. He relates he will probably stop allopurinol about 6 weeks before that. Therefore check metabolic 7 uric acid level before next visit in approximately 6 months   the importance of exercise losing weight and a low-protein diet was discussed

## 2013-04-16 ENCOUNTER — Encounter: Payer: Self-pay | Admitting: Family Medicine

## 2013-04-16 ENCOUNTER — Ambulatory Visit (INDEPENDENT_AMBULATORY_CARE_PROVIDER_SITE_OTHER): Payer: 59 | Admitting: Family Medicine

## 2013-04-16 VITALS — BP 148/98 | Temp 98.8°F | Ht 67.0 in | Wt 208.0 lb

## 2013-04-16 DIAGNOSIS — R300 Dysuria: Secondary | ICD-10-CM

## 2013-04-16 DIAGNOSIS — E785 Hyperlipidemia, unspecified: Secondary | ICD-10-CM

## 2013-04-16 DIAGNOSIS — N419 Inflammatory disease of prostate, unspecified: Secondary | ICD-10-CM

## 2013-04-16 LAB — POCT URINALYSIS DIPSTICK
SPEC GRAV UA: 1.015
pH, UA: 5

## 2013-04-16 MED ORDER — ALLOPURINOL 100 MG PO TABS: 100.0000 mg | ORAL_TABLET | Freq: Every day | ORAL | Status: DC

## 2013-04-16 MED ORDER — CIPROFLOXACIN HCL 500 MG PO TABS: 500.0000 mg | ORAL_TABLET | Freq: Two times a day (BID) | ORAL | Status: AC

## 2013-04-16 NOTE — Progress Notes (Signed)
   Subjective:    Patient ID: Joseph Rodgers, male    DOB: Jan 18, 1951, 63 y.o.   MRN: [id number]  Back Pain This is a new problem. The current episode started in the past 7 days. Associated symptoms include dysuria. (Frequent urination)   Woke up 3 am this am back pain and dysuria, no fever, took tylenol Radiates into right leg Past 2 days felt a little weak Some cough, when he does it causes soime right leg pain pmh prostate Review of Systems  Genitourinary: Positive for dysuria.  Musculoskeletal: Positive for back pain.  no n v some diarrhea     Objective:   Physical Exam His lungs are clear hearts regular flanks nontender his abdomen is soft prostate exam enlarged and tender. Extremities no edema       Assessment & Plan:  #1 prostatitis antibiotics prescribed warning signs discussed followup if ongoing trouble  He should check uric acid level in April along with lipid profile history of gout. He hopes to be alert, off of uric acid.

## 2013-06-08 ENCOUNTER — Other Ambulatory Visit: Payer: Self-pay | Admitting: Family Medicine

## 2013-06-08 LAB — LIPID PANEL
Cholesterol: 241 mg/dL — ABNORMAL HIGH (ref 0–200)
HDL: 40 mg/dL (ref >39)
LDL Cholesterol: 162 mg/dL — ABNORMAL HIGH (ref 0–99)
Total CHOL/HDL Ratio: 6 Ratio
Triglycerides: 194 mg/dL — ABNORMAL HIGH (ref <150)
VLDL: 39 mg/dL (ref 0–40)

## 2013-06-10 ENCOUNTER — Encounter: Payer: Self-pay | Admitting: *Deleted

## 2013-06-10 LAB — URIC ACID: Uric Acid, Serum: 7.9 mg/dL — ABNORMAL HIGH (ref 4.0–7.8)

## 2013-06-14 NOTE — Progress Notes (Signed)
Patient notified and verbalized understanding. 

## 2013-09-16 ENCOUNTER — Telehealth: Payer: Self-pay | Admitting: *Deleted

## 2013-09-16 DIAGNOSIS — E782 Mixed hyperlipidemia: Secondary | ICD-10-CM

## 2013-09-16 DIAGNOSIS — M109 Gout, unspecified: Secondary | ICD-10-CM

## 2013-09-16 DIAGNOSIS — Z79899 Other long term (current) drug therapy: Secondary | ICD-10-CM

## 2013-09-16 DIAGNOSIS — Z125 Encounter for screening for malignant neoplasm of prostate: Secondary | ICD-10-CM

## 2013-09-16 NOTE — Telephone Encounter (Signed)
Lipid, liver, metabolic 7, uric acid, PSA 

## 2013-09-16 NOTE — Telephone Encounter (Signed)
Blood work orders placed in Epic. Patient notified. 

## 2013-09-16 NOTE — Telephone Encounter (Signed)
Pt called wanting to know if he needs any blood work before his appt 10/03/46 at 8:10 for a physical. Pt had a lipid panel done 06/08/13 please advise [phone]

## 2013-09-21 LAB — BASIC METABOLIC PANEL
BUN: 15 mg/dL (ref 6–23)
CHLORIDE: 101 meq/L (ref 96–112)
CO2: 27 meq/L (ref 19–32)
Calcium: 9.6 mg/dL (ref 8.4–10.5)
Creat: 1.05 mg/dL (ref 0.50–1.35)
GLUCOSE: 98 mg/dL (ref 70–99)
POTASSIUM: 4.5 meq/L (ref 3.5–5.3)
SODIUM: 138 meq/L (ref 135–145)

## 2013-09-21 LAB — HEPATIC FUNCTION PANEL
ALT: 25 U/L (ref 0–53)
AST: 26 U/L (ref 0–37)
Albumin: 4.5 g/dL (ref 3.5–5.2)
Alkaline Phosphatase: 58 U/L (ref 39–117)
BILIRUBIN DIRECT: 0.1 mg/dL (ref 0.0–0.3)
BILIRUBIN INDIRECT: 0.7 mg/dL (ref 0.2–1.2)
Total Bilirubin: 0.8 mg/dL (ref 0.2–1.2)
Total Protein: 6.8 g/dL (ref 6.0–8.3)

## 2013-09-21 LAB — LIPID PANEL
CHOLESTEROL: 229 mg/dL — AB (ref 0–200)
HDL: 37 mg/dL — ABNORMAL LOW (ref >39)
LDL CALC: 116 mg/dL — AB (ref 0–99)
Total CHOL/HDL Ratio: 6.2 Ratio
Triglycerides: 379 mg/dL — ABNORMAL HIGH (ref <150)
VLDL: 76 mg/dL — ABNORMAL HIGH (ref 0–40)

## 2013-09-21 LAB — URIC ACID: Uric Acid, Serum: 8.1 mg/dL — ABNORMAL HIGH (ref 4.0–7.8)

## 2013-09-23 LAB — PSA: PSA: 1.22 ng/mL (ref <=4.00)

## 2013-10-02 ENCOUNTER — Encounter: Payer: Self-pay | Admitting: Family Medicine

## 2013-10-02 ENCOUNTER — Telehealth: Payer: Self-pay | Admitting: Family Medicine

## 2013-10-02 ENCOUNTER — Ambulatory Visit (INDEPENDENT_AMBULATORY_CARE_PROVIDER_SITE_OTHER): Payer: 59 | Admitting: Family Medicine

## 2013-10-02 VITALS — BP 140/78 | Ht 66.0 in | Wt 203.0 lb

## 2013-10-02 DIAGNOSIS — Z0000 Encounter for general adult medical examination without abnormal findings: Secondary | ICD-10-CM

## 2013-10-02 DIAGNOSIS — M1000 Idiopathic gout, unspecified site: Secondary | ICD-10-CM

## 2013-10-02 DIAGNOSIS — E785 Hyperlipidemia, unspecified: Secondary | ICD-10-CM

## 2013-10-02 DIAGNOSIS — M109 Gout, unspecified: Secondary | ICD-10-CM

## 2013-10-02 DIAGNOSIS — I10 Essential (primary) hypertension: Secondary | ICD-10-CM

## 2013-10-02 MED ORDER — LISINOPRIL 10 MG PO TABS: 10.0000 mg | ORAL_TABLET | Freq: Every day | ORAL | Status: DC

## 2013-10-02 NOTE — Telephone Encounter (Signed)
Med resent. Pt notified. 

## 2013-10-02 NOTE — Telephone Encounter (Signed)
lisinopril (PRINIVIL,ZESTRIL) 10 MG tablet   Pt states [address] Joseph Rodgers is saying they never got this  Script from this AM, however it does show they receipted it  Can we resend or call this in for the PT

## 2013-10-02 NOTE — Progress Notes (Signed)
   Subjective:    Patient ID: Joseph Rodgers, male    DOB: 07/31/1950, 63 y.o.   MRN: [id number]  HPI The patient comes in today for a wellness visit.    A review of their health history was completed.  A review of medications was also completed.  Any needed refills; lisinopril 90 day    Eating habits: health conscious  Falls/  MVA accidents in past few months: none  Regular exercise: walking every day  Specialist pt sees on regular basis: none  Preventative health issues were discussed.   Additional concerns: nausea and diarrhea for the past 2 days.     Review of Systems  Constitutional: Negative for fever, activity change and appetite change.  HENT: Negative for congestion and rhinorrhea.   Eyes: Negative for discharge.  Respiratory: Negative for cough and wheezing.   Cardiovascular: Negative for chest pain.  Gastrointestinal: Negative for vomiting, abdominal pain and blood in stool.  Genitourinary: Negative for frequency and difficulty urinating.  Musculoskeletal: Negative for neck pain.  Skin: Negative for rash.  Allergic/Immunologic: Negative for environmental allergies and food allergies.  Neurological: Negative for weakness and headaches.  Psychiatric/Behavioral: Negative for agitation.       Objective:   Physical Exam  Constitutional: He appears well-developed and well-nourished.  HENT:  Head: Normocephalic and atraumatic.  Right Ear: External ear normal.  Left Ear: External ear normal.  Nose: Nose normal.  Mouth/Throat: Oropharynx is clear and moist.  Eyes: EOM are normal. Pupils are equal, round, and reactive to light.  Neck: Normal range of motion. Neck supple. No thyromegaly present.  Cardiovascular: Normal rate, regular rhythm and normal heart sounds.   No murmur heard. Pulmonary/Chest: Effort normal and breath sounds normal. No respiratory distress. He has no wheezes.  Abdominal: Soft. Bowel sounds are normal. He exhibits no distension and no  mass. There is no tenderness.  Genitourinary: Penis normal.  Musculoskeletal: Normal range of motion. He exhibits no edema.  Lymphadenopathy:    He has no cervical adenopathy.  Neurological: He is alert. He exhibits normal muscle tone.  Skin: Skin is warm and dry. No erythema.  Psychiatric: He has a normal mood and affect. His behavior is normal. Judgment normal.          Assessment & Plan:  #1 benign skin lesions I don't see any signs of cancer on his back  #2 wellness-safety measures dietary measures discussed patient encouraged to try to lose weight  #3 hyperlipidemia does not tolerate statins continue healthy diet recheck in 6 months  #4 HTN increase lisinopril to 20 mg followup in 6 months  #5 due to diarrhea prostate exam on next visit  #6 gallops-patient had trouble with medication hold off on it for now if flareups notify us

## 2013-10-03 ENCOUNTER — Other Ambulatory Visit: Payer: Self-pay | Admitting: *Deleted

## 2013-10-03 ENCOUNTER — Telehealth: Payer: Self-pay | Admitting: Family Medicine

## 2013-10-03 MED ORDER — LISINOPRIL 20 MG PO TABS: 20.0000 mg | ORAL_TABLET | Freq: Every day | ORAL | Status: DC

## 2013-10-03 NOTE — Telephone Encounter (Signed)
Pt.notified

## 2013-10-03 NOTE — Telephone Encounter (Signed)
Patient said that he had lisinopril 10 mg called in yesterday, but it was supposed to be lisinopril 20 mg. He said it was raised yesterday while at appointment. Please advise. He would like this before lunch because he has to go out of town.  [name]

## 2013-10-03 NOTE — Telephone Encounter (Signed)
Please call when completed.

## 2013-10-03 NOTE — Telephone Encounter (Signed)
Med list changed. Pharm called to cancel refills on 10mg  and 20mg  sent to pharm.

## 2014-01-03 ENCOUNTER — Ambulatory Visit (INDEPENDENT_AMBULATORY_CARE_PROVIDER_SITE_OTHER): Payer: 59 | Admitting: Family Medicine

## 2014-01-03 ENCOUNTER — Encounter: Payer: Self-pay | Admitting: Family Medicine

## 2014-01-03 VITALS — BP 132/86 | Temp 98.6°F | Ht 66.0 in | Wt 202.0 lb

## 2014-01-03 DIAGNOSIS — N410 Acute prostatitis: Secondary | ICD-10-CM

## 2014-01-03 MED ORDER — CIPROFLOXACIN HCL 500 MG PO TABS: 500.0000 mg | ORAL_TABLET | Freq: Two times a day (BID) | ORAL | Status: DC

## 2014-01-03 NOTE — Progress Notes (Signed)
   Subjective:    Patient ID: Joseph Rodgers, male    DOB: 01-02-1951, 63 y.o.   MRN: [id number]  HPI Patient is having testicular pain and rectal pain. No blood in the urine.  He has had these s/s before and it was a prostate infection.  S/s started 3 days ago.  Symptoms been going on for the past several days relates frequency and lower abdominal discomfort not feeling well Denies high fever chills Review of Systems  Gastrointestinal: Positive for abdominal pain.   No vomiting diarrhea    Objective:   Physical Exam Lungs clear heart regular abdomen soft prostate moderate tenderness       Assessment & Plan:  Prostatitis antibiotics prescribed warning signs discussed patient not toxic should gradually get better patient is to follow-up if progressive problems

## 2014-09-23 ENCOUNTER — Other Ambulatory Visit: Payer: Self-pay | Admitting: *Deleted

## 2014-09-23 ENCOUNTER — Telehealth: Payer: Self-pay | Admitting: Family Medicine

## 2014-09-23 ENCOUNTER — Other Ambulatory Visit: Payer: Self-pay | Admitting: Family Medicine

## 2014-09-23 DIAGNOSIS — I10 Essential (primary) hypertension: Secondary | ICD-10-CM

## 2014-09-23 DIAGNOSIS — M109 Gout, unspecified: Secondary | ICD-10-CM

## 2014-09-23 DIAGNOSIS — Z125 Encounter for screening for malignant neoplasm of prostate: Secondary | ICD-10-CM

## 2014-09-23 DIAGNOSIS — Z79899 Other long term (current) drug therapy: Secondary | ICD-10-CM

## 2014-09-23 DIAGNOSIS — E785 Hyperlipidemia, unspecified: Secondary | ICD-10-CM

## 2014-09-23 NOTE — Telephone Encounter (Signed)
bw orders ready. Pt notified on vm.

## 2014-09-23 NOTE — Telephone Encounter (Signed)
Lipid, liver, metabolic 7, uric acid, PSA 

## 2014-09-23 NOTE — Telephone Encounter (Signed)
Needs labs for appt 8/23 aware of [facility]   Last labs 06/08/13 Lip, Uric Acid

## 2014-09-29 LAB — BASIC METABOLIC PANEL
BUN/Creatinine Ratio: 22 (ref 10–22)
BUN: 27 mg/dL (ref 8–27)
CO2: 23 mmol/L (ref 18–29)
Calcium: 9.6 mg/dL (ref 8.6–10.2)
Chloride: 99 mmol/L (ref 97–108)
Creatinine, Ser: 1.21 mg/dL (ref 0.76–1.27)
GFR calc Af Amer: 73 mL/min/{1.73_m2} (ref >59)
GFR, EST NON AFRICAN AMERICAN: 63 mL/min/{1.73_m2} (ref >59)
GLUCOSE: 95 mg/dL (ref 65–99)
POTASSIUM: 5 mmol/L (ref 3.5–5.2)
Sodium: 139 mmol/L (ref 134–144)

## 2014-09-29 LAB — PSA: PROSTATE SPECIFIC AG, SERUM: 1.3 ng/mL (ref 0.0–4.0)

## 2014-09-29 LAB — LIPID PANEL
CHOLESTEROL TOTAL: 244 mg/dL — AB (ref 100–199)
Chol/HDL Ratio: 5.1 ratio units — ABNORMAL HIGH (ref 0.0–5.0)
HDL: 48 mg/dL (ref >39)
LDL CALC: 171 mg/dL — AB (ref 0–99)
Triglycerides: 127 mg/dL (ref 0–149)
VLDL Cholesterol Cal: 25 mg/dL (ref 5–40)

## 2014-09-29 LAB — HEPATIC FUNCTION PANEL
ALBUMIN: 4.7 g/dL (ref 3.6–4.8)
ALT: 31 IU/L (ref 0–44)
AST: 29 IU/L (ref 0–40)
Alkaline Phosphatase: 63 IU/L (ref 39–117)
BILIRUBIN, DIRECT: 0.17 mg/dL (ref 0.00–0.40)
Bilirubin Total: 0.8 mg/dL (ref 0.0–1.2)
Total Protein: 7.3 g/dL (ref 6.0–8.5)

## 2014-09-29 LAB — URIC ACID: URIC ACID: 9.6 mg/dL — AB (ref 3.7–8.6)

## 2014-10-07 ENCOUNTER — Encounter: Payer: Self-pay | Admitting: Family Medicine

## 2014-10-07 ENCOUNTER — Ambulatory Visit (INDEPENDENT_AMBULATORY_CARE_PROVIDER_SITE_OTHER): Payer: 59 | Admitting: Family Medicine

## 2014-10-07 VITALS — BP 130/86 | Ht 66.0 in | Wt 198.2 lb

## 2014-10-07 DIAGNOSIS — E785 Hyperlipidemia, unspecified: Secondary | ICD-10-CM | POA: Diagnosis not present

## 2014-10-07 DIAGNOSIS — I10 Essential (primary) hypertension: Secondary | ICD-10-CM | POA: Diagnosis not present

## 2014-10-07 DIAGNOSIS — Z0000 Encounter for general adult medical examination without abnormal findings: Secondary | ICD-10-CM

## 2014-10-07 MED ORDER — LISINOPRIL 20 MG PO TABS: 20.0000 mg | ORAL_TABLET | Freq: Every day | ORAL | Status: DC

## 2014-10-07 NOTE — Patient Instructions (Signed)
Dear Patient,  It has been recommended to you that you have a colonoscopy. It is your responsibility to carry through with this recommendation.   Did you realize that colon cancer is the second leading cancer killer in the [place]. One in every 20 adults will get colon cancer. If all adults would go through the recommended screening for colon cancer (getting a colonoscopy), then there would be a 60% reduction in the number of people dying from colon cancer.  Colon cancer just doesn't come out of the blue. It starts off as a small polyp which over time grows into a cancer. A colonoscopy can prevent cancer and in many cases detected when it is at a very treatable phase. Small colon cancers can have cure rates of 95%. Advanced colon cancer, which often occurs in people who do not do their screenings, have cure rates less than 20%. The risk of colon cancer advances with age. Most adults should have regular colonoscopies every 10 years starting at age 50. This recommendation can vary depending on a person's medical history.  Health-care laws now allow for you to call the gastroenterologist office directly in order to set yourself up for this very important tests. Today we have recommended to you that you do this test. This test may save your life. Failure to do this test puts you at risk for premature death from colon cancer. Do the right thing and schedule this test now.  Here as a list of specialists we recommend in the surrounding area. When you call their office let them know that you are a patient of our practice in your interested in doing a screening colonoscopy. They should assist you without problems. You will need the following information when you called them: 1-name of which Dr. you see, 2-your insurance information, 3-a list of medications that you currently take, 4-any allergies you have to medications.  [place] gastroenterologist Dr. Mike Rourk, Dr Sandi Fields   [place]  gastroenterologist   [phone]  Dr.Najeeb Rehman [facility]   [phone]  [place] [facility] (Dr. Perry, N, Stark, Brodie, Gesner, Jacobs and Pyrtle) [phone]  [facility] (Dr. Buscemi, Edwards, Hayes, Maygod,Outlaw,Schooler) [phone]  Each group of specialists has assured us that when you called them they will help you get your colonoscopy set up. Should you have problems or if the GI practice insist a referral be done please let us know. Be sure to call soon. Sincerely, Carolyn Hoskins, Dr Steve Oluwafemi Villella, Dr.Mettie Roylance    

## 2014-10-07 NOTE — Progress Notes (Signed)
Subjective:    Patient ID: Joseph Rodgers, male    DOB: 1950-12-14, 64 y.o.   MRN: [id number]  Headache  This is a new (started in June) problem. The current episode started more than 1 month ago. The problem occurs intermittently. The pain is located in the occipital (more on the right side) region. The pain radiates to the right neck (turning worse with the pain). The quality of the pain is described as aching. The pain is at a severity of 6/10. The pain is moderate. Associated symptoms include neck pain. Pertinent negatives include no abdominal pain, coughing, fever, insomnia, rhinorrhea, vomiting or weakness. The symptoms are aggravated by activity and food. Treatments tried: takes 2 tylenol occasionally Advil. Tylenol seemed to do better. The treatment provided mild relief. His past medical history is significant for hypertension.  Hypertension This is a chronic problem. The current episode started more than 1 year ago. Associated symptoms include headaches and neck pain. Pertinent negatives include no chest pain. There are no associated agents to hypertension. Risk factors for coronary artery disease include dyslipidemia and male gender. Past treatments include ACE inhibitors. The current treatment provides significant improvement. There are no compliance problems.  There is no history of angina or CAD/MI.  Hyperlipidemia This is a chronic problem. The current episode started more than 1 year ago. The problem is uncontrolled. Recent lipid tests were reviewed and are high. Pertinent negatives include no chest pain. He is currently on no antihyperlipidemic treatment.   The patient comes in today for a wellness visit.  Does not awaken him, generally in the afternoon No blurred or double vision, no N or V No fever or sinus pain A review of their health history was completed.  A review of medications was also completed.  Any needed refills: yes, lisinopril  Eating habits: good  Falls/  MVA  accidents in past few months: none  Regular exercise: yes, walking  Specialist pt sees on regular basis: none  Preventative health issues were discussed.   Additional concerns: Patient wants a paper prescription printed out for his lisinopril.   Patient has been having headaches that he would like to talk to the doctor about.   Review of Systems  Constitutional: Negative for fever, activity change and appetite change.  HENT: Negative for congestion and rhinorrhea.   Eyes: Negative for discharge.  Respiratory: Negative for cough and wheezing.   Cardiovascular: Negative for chest pain.  Gastrointestinal: Negative for vomiting, abdominal pain and blood in stool.  Genitourinary: Negative for frequency and difficulty urinating.  Musculoskeletal: Positive for neck pain.  Skin: Negative for rash.  Allergic/Immunologic: Negative for environmental allergies and food allergies.  Neurological: Positive for headaches. Negative for weakness.  Psychiatric/Behavioral: Negative for agitation. The patient does not have insomnia.        Objective:   Physical Exam  Constitutional: He appears well-developed and well-nourished.  HENT:  Head: Normocephalic and atraumatic.  Right Ear: External ear normal.  Left Ear: External ear normal.  Nose: Nose normal.  Mouth/Throat: Oropharynx is clear and moist.  Eyes: EOM are normal. Pupils are equal, round, and reactive to light.  Neck: Normal range of motion. Neck supple. No thyromegaly present.  Cardiovascular: Normal rate, regular rhythm and normal heart sounds.   No murmur heard. Pulmonary/Chest: Effort normal and breath sounds normal. No respiratory distress. He has no wheezes.  Abdominal: Soft. Bowel sounds are normal. He exhibits no distension and no mass. There is no tenderness.  Genitourinary: Penis  normal.  Musculoskeletal: Normal range of motion. He exhibits no edema.  Lymphadenopathy:    He has no cervical adenopathy.  Neurological: He is  alert. He exhibits normal muscle tone.  Skin: Skin is warm and dry. No erythema.  Psychiatric: He has a normal mood and affect. His behavior is normal. Judgment normal.          Assessment & Plan:  headaches-this sounds like occipital headaches. He will try anti-inflammatory. He will keep track of headaches over the course of next month. He will give [place] an update on how his headaches are doing. May need to be on a preventative medicine. Do not need to start that currently. There are no red flags I do not recommend MRI at this point. Patient will follow-up if progressive headaches or worse.  HTN good control continue current measures. Watch diet closely  Hyperlipidemia patient does not tolerate any statins. Unfortunately his best bet is healthy eating and regular physical activity. He may benefit from knee were cholesterol treatment measures once wider use of this is used for his condition.  Wellness-he was advised to get shingles vaccine. He was advised to get colonoscopy he will state he'll get that somewhere within the next year he will do Hemoccults cards 3 he was advised safety and dietary measures.  We also discussed the importance of minimizing her diet her uric acid down no recent gout attacks

## 2014-10-16 LAB — POC HEMOCCULT BLD/STL (HOME/3-CARD/SCREEN)
Card #2 Fecal Occult Blod, POC: NEGATIVE
Card #3 Fecal Occult Blood, POC: NEGATIVE
FECAL OCCULT BLD: NEGATIVE

## 2014-10-21 ENCOUNTER — Encounter: Payer: Self-pay | Admitting: Family Medicine

## 2015-04-02 ENCOUNTER — Telehealth: Payer: Self-pay | Admitting: Family Medicine

## 2015-04-02 MED ORDER — METRONIDAZOLE 500 MG PO TABS: 500.0000 mg | ORAL_TABLET | Freq: Three times a day (TID) | ORAL | Status: DC

## 2015-04-02 MED ORDER — CIPROFLOXACIN HCL 500 MG PO TABS: 500.0000 mg | ORAL_TABLET | Freq: Two times a day (BID) | ORAL | Status: DC

## 2015-04-02 NOTE — Telephone Encounter (Signed)
Please talk with Joseph Rodgers-if he would like to be seen today I will be happy to see him at the end of today's visits. It would be wise for [place] to examine him to make sure this is not a surgical issue I doubt we will have to do CAT scan her lab work. Once again I am more than willing to work with him to get him seen today thank you

## 2015-04-02 NOTE — Addendum Note (Signed)
Addended by: Dairl Ponder on: 04/02/2015 04:08 PM   Modules accepted: Orders

## 2015-04-02 NOTE — Telephone Encounter (Signed)
Left lower abdominal pain since yesterday-has hx of diverticulitis and states it feels the same- no fever or vomiting-little diarrhea

## 2015-04-02 NOTE — Telephone Encounter (Signed)
cipro 500 one bid 10 days, flagyl 500, 1 tid for 7 days, if worse/fever/severe pain the needs er or immedeate check

## 2015-04-02 NOTE — Telephone Encounter (Addendum)
Rx sent electronically to pharmacy. Patient notified and advised if worse/fever/severe pain the needs ER or immediate check. Patient verbalized understanding.

## 2015-04-02 NOTE — Telephone Encounter (Addendum)
I spoke with patient and he said he could come in if he had too but he already has appt with you on Monday and was hoping to get med sent in today and keep appt Monday so he doesn't have to come in twice

## 2015-04-02 NOTE — Telephone Encounter (Signed)
Pt called stating that his diverticulitis is flared up and wants to know if something can be called in for it. Pt has an appt Monday but can't wait till then.     [facility]

## 2015-04-06 ENCOUNTER — Encounter: Payer: Self-pay | Admitting: Family Medicine

## 2015-04-06 ENCOUNTER — Ambulatory Visit (INDEPENDENT_AMBULATORY_CARE_PROVIDER_SITE_OTHER): Payer: 59 | Admitting: Family Medicine

## 2015-04-06 VITALS — BP 134/88 | Ht 66.0 in | Wt 205.6 lb

## 2015-04-06 DIAGNOSIS — I10 Essential (primary) hypertension: Secondary | ICD-10-CM

## 2015-04-06 DIAGNOSIS — Z23 Encounter for immunization: Secondary | ICD-10-CM

## 2015-04-06 DIAGNOSIS — E785 Hyperlipidemia, unspecified: Secondary | ICD-10-CM | POA: Diagnosis not present

## 2015-04-06 MED ORDER — MOMETASONE FUROATE 0.1 % EX CREA: TOPICAL_CREAM | CUTANEOUS | Status: DC

## 2015-04-06 MED ORDER — ASPIRIN 81 MG PO TABS: 81.0000 mg | ORAL_TABLET | Freq: Every day | ORAL | Status: DC

## 2015-04-06 NOTE — Patient Instructions (Signed)
DASH Eating Plan  DASH stands for "Dietary Approaches to Stop Hypertension." The DASH eating plan is a healthy eating plan that has been shown to reduce high blood pressure (hypertension). Additional health benefits may include reducing the risk of type 2 diabetes mellitus, heart disease, and stroke. The DASH eating plan may also help with weight loss.  WHAT DO I NEED TO KNOW ABOUT THE DASH EATING PLAN?  For the DASH eating plan, you will follow these general guidelines:  · Choose foods with a percent daily value for sodium of less than 5% (as listed on the food label).  · Use salt-free seasonings or herbs instead of table salt or sea salt.  · Check with your health care provider or pharmacist before using salt substitutes.  · Eat lower-sodium products, often labeled as "lower sodium" or "no salt added."  · Eat fresh foods.  · Eat more vegetables, fruits, and low-fat dairy products.  · Choose whole grains. Look for the word "whole" as the first word in the ingredient list.  · Choose fish and skinless chicken or turkey more often than red meat. Limit fish, poultry, and meat to 6 oz (170 g) each day.  · Limit sweets, desserts, sugars, and sugary drinks.  · Choose heart-healthy fats.  · Limit cheese to 1 oz (28 g) per day.  · Eat more home-cooked food and less restaurant, buffet, and fast food.  · Limit fried foods.  · Cook foods using methods other than frying.  · Limit canned vegetables. If you do use them, rinse them well to decrease the sodium.  · When eating at a restaurant, ask that your food be prepared with less salt, or no salt if possible.  WHAT FOODS CAN I EAT?  Seek help from a dietitian for individual calorie needs.  Grains  Whole grain or whole wheat bread. Brown rice. Whole grain or whole wheat pasta. Quinoa, bulgur, and whole grain cereals. Low-sodium cereals. Corn or whole wheat flour tortillas. Whole grain cornbread. Whole grain crackers. Low-sodium crackers.  Vegetables  Fresh or frozen vegetables  (raw, steamed, roasted, or grilled). Low-sodium or reduced-sodium tomato and vegetable juices. Low-sodium or reduced-sodium tomato sauce and paste. Low-sodium or reduced-sodium canned vegetables.   Fruits  All fresh, canned (in natural juice), or frozen fruits.  Meat and Other Protein Products  Ground beef (85% or leaner), grass-fed beef, or beef trimmed of fat. Skinless chicken or turkey. Ground chicken or turkey. Pork trimmed of fat. All fish and seafood. Eggs. Dried beans, peas, or lentils. Unsalted nuts and seeds. Unsalted canned beans.  Dairy  Low-fat dairy products, such as skim or 1% milk, 2% or reduced-fat cheeses, low-fat ricotta or cottage cheese, or plain low-fat yogurt. Low-sodium or reduced-sodium cheeses.  Fats and Oils  Tub margarines without trans fats. Light or reduced-fat mayonnaise and salad dressings (reduced sodium). Avocado. Safflower, olive, or canola oils. Natural peanut or almond butter.  Other  Unsalted popcorn and pretzels.  The items listed above may not be a complete list of recommended foods or beverages. Contact your dietitian for more options.  WHAT FOODS ARE NOT RECOMMENDED?  Grains  White bread. White pasta. White rice. Refined cornbread. Bagels and croissants. Crackers that contain trans fat.  Vegetables  Creamed or fried vegetables. Vegetables in a cheese sauce. Regular canned vegetables. Regular canned tomato sauce and paste. Regular tomato and vegetable juices.  Fruits  Dried fruits. Canned fruit in light or heavy syrup. Fruit juice.  Meat and Other Protein   Products  Fatty cuts of meat. Ribs, chicken wings, bacon, sausage, bologna, salami, chitterlings, fatback, hot dogs, bratwurst, and packaged luncheon meats. Salted nuts and seeds. Canned beans with salt.  Dairy  Whole or 2% milk, cream, half-and-half, and cream cheese. Whole-fat or sweetened yogurt. Full-fat cheeses or blue cheese. Nondairy creamers and whipped toppings. Processed cheese, cheese spreads, or cheese  curds.  Condiments  Onion and garlic salt, seasoned salt, table salt, and sea salt. Canned and packaged gravies. Worcestershire sauce. Tartar sauce. Barbecue sauce. Teriyaki sauce. Soy sauce, including reduced sodium. Steak sauce. Fish sauce. Oyster sauce. Cocktail sauce. Horseradish. Ketchup and mustard. Meat flavorings and tenderizers. Bouillon cubes. Hot sauce. Tabasco sauce. Marinades. Taco seasonings. Relishes.  Fats and Oils  Butter, stick margarine, lard, shortening, ghee, and bacon fat. Coconut, palm kernel, or palm oils. Regular salad dressings.  Other  Pickles and olives. Salted popcorn and pretzels.  The items listed above may not be a complete list of foods and beverages to avoid. Contact your dietitian for more information.  WHERE CAN I FIND MORE INFORMATION?  National Heart, Lung, and Blood Institute: www.nhlbi.nih.gov/health/health-topics/topics/dash/     This information is not intended to replace advice given to you by your health care provider. Make sure you discuss any questions you have with your health care provider.     Document Released: 01/20/2011 Document Revised: 02/21/2014 Document Reviewed: 12/05/2012  Elsevier Interactive Patient Education ©2016 [employer]

## 2015-04-06 NOTE — Progress Notes (Signed)
   Subjective:    Patient ID: Joseph Rodgers, male    DOB: 1950/05/30, 65 y.o.   MRN: [id number]  Hypertension This is a chronic problem. The current episode started more than 1 year ago. Pertinent negatives include no chest pain. Risk factors for coronary artery disease include male gender. Treatments tried: lisinopril. There are no compliance problems.    Patient states the pain assoc with diverticulitis last week is doing better and he is currently still taking the meds  he was having left lower quadrant pain and discomfort he states is actually doing much better than what it was. He states his overall energy level is improving. He denies rectal bleeding  Patient understands his cholesterol in the past is been elevated in the importance of try to keep this under good control , time spent today discussing 81 mg aspirin.  Review of Systems  Constitutional: Negative for activity change, appetite change and fatigue.  HENT: Negative for congestion.   Respiratory: Negative for cough.   Cardiovascular: Negative for chest pain.  Gastrointestinal: Negative for abdominal pain.  Endocrine: Negative for polydipsia and polyphagia.  Neurological: Negative for weakness.  Psychiatric/Behavioral: Negative for confusion.   patient denies any gout flareups denies any chest pressure tightness or pain     Objective:   Physical Exam  Constitutional: He appears well-nourished. No distress.  Cardiovascular: Normal rate, regular rhythm and normal heart sounds.   No murmur heard. Pulmonary/Chest: Effort normal and breath sounds normal. No respiratory distress.  Musculoskeletal: He exhibits no edema.  Lymphadenopathy:    He has no cervical adenopathy.  Neurological: He is alert.  Psychiatric: His behavior is normal.  Vitals reviewed.  abd soft  25 minutes was spent with the patient. Greater than half the time was spent in discussion and answering questions and counseling regarding the issues that the  patient came in for today.      Assessment & Plan:   hypertension good control -continue current medication  heart disease prevention 81 mg aspirin recommended if it bothers his stomach may stop it  check metabolic 7 to look at kidney functions  hyperlipidemia patient has not been thrilled with doing statins , simvastatin caused issues for him with myalgias, we will check lipid profile liver profile healthy diet   pneumonia vaccine today Recent diverticulitis much better finish out antibiotics   colonoscopy recommended but patient defers currently he will discuss this more on the follow-up visit

## 2015-04-07 LAB — BASIC METABOLIC PANEL
BUN/Creatinine Ratio: 16 (ref 10–22)
BUN: 16 mg/dL (ref 8–27)
CALCIUM: 9.6 mg/dL (ref 8.6–10.2)
CHLORIDE: 103 mmol/L (ref 96–106)
CO2: 22 mmol/L (ref 18–29)
Creatinine, Ser: 1.03 mg/dL (ref 0.76–1.27)
GFR calc Af Amer: 88 mL/min/{1.73_m2} (ref >59)
GFR calc non Af Amer: 76 mL/min/{1.73_m2} (ref >59)
GLUCOSE: 102 mg/dL — AB (ref 65–99)
POTASSIUM: 4.7 mmol/L (ref 3.5–5.2)
Sodium: 144 mmol/L (ref 134–144)

## 2015-04-07 LAB — HEPATIC FUNCTION PANEL
ALBUMIN: 4.4 g/dL (ref 3.6–4.8)
ALT: 46 IU/L — ABNORMAL HIGH (ref 0–44)
AST: 50 IU/L — ABNORMAL HIGH (ref 0–40)
Alkaline Phosphatase: 72 IU/L (ref 39–117)
BILIRUBIN TOTAL: 0.3 mg/dL (ref 0.0–1.2)
BILIRUBIN, DIRECT: 0.1 mg/dL (ref 0.00–0.40)
TOTAL PROTEIN: 6.9 g/dL (ref 6.0–8.5)

## 2015-04-07 LAB — LIPID PANEL
CHOL/HDL RATIO: 4 ratio (ref 0.0–5.0)
Cholesterol, Total: 188 mg/dL (ref 100–199)
HDL: 47 mg/dL (ref >39)
LDL CALC: 116 mg/dL — AB (ref 0–99)
Triglycerides: 123 mg/dL (ref 0–149)
VLDL Cholesterol Cal: 25 mg/dL (ref 5–40)

## 2015-04-08 ENCOUNTER — Other Ambulatory Visit: Payer: Self-pay | Admitting: *Deleted

## 2015-04-08 DIAGNOSIS — R748 Abnormal levels of other serum enzymes: Secondary | ICD-10-CM

## 2015-04-14 LAB — HEPATIC FUNCTION PANEL
ALBUMIN: 4.6 g/dL (ref 3.6–4.8)
ALK PHOS: 68 IU/L (ref 39–117)
ALT: 41 IU/L (ref 0–44)
AST: 30 IU/L (ref 0–40)
BILIRUBIN TOTAL: 0.4 mg/dL (ref 0.0–1.2)
BILIRUBIN, DIRECT: 0.12 mg/dL (ref 0.00–0.40)
Total Protein: 7.3 g/dL (ref 6.0–8.5)

## 2015-04-14 LAB — HEPATITIS B SURFACE ANTIGEN: Hepatitis B Surface Ag: NEGATIVE

## 2015-04-14 LAB — HEPATITIS C ANTIBODY

## 2015-04-14 LAB — FERRITIN: Ferritin: 397 ng/mL (ref 30–400)

## 2015-04-14 LAB — ANA: ANA: NEGATIVE

## 2015-04-14 LAB — CERULOPLASMIN: Ceruloplasmin: 30.9 mg/dL (ref 16.0–31.0)

## 2015-04-17 ENCOUNTER — Ambulatory Visit (HOSPITAL_COMMUNITY)
Admission: RE | Admit: 2015-04-17 | Discharge: 2015-04-17 | Disposition: A | Discharge status: Home / Self Care | Payer: 59 | Source: Ambulatory Visit | Attending: Family Medicine | Admitting: Family Medicine

## 2015-04-17 DIAGNOSIS — K8020 Calculus of gallbladder without cholecystitis without obstruction: Secondary | ICD-10-CM | POA: Diagnosis not present

## 2015-04-17 DIAGNOSIS — K760 Fatty (change of) liver, not elsewhere classified: Secondary | ICD-10-CM | POA: Diagnosis not present

## 2015-04-17 DIAGNOSIS — R748 Abnormal levels of other serum enzymes: Secondary | ICD-10-CM | POA: Diagnosis not present

## 2015-05-12 ENCOUNTER — Encounter: Payer: Self-pay | Admitting: Family Medicine

## 2015-05-12 LAB — IRON AND TIBC
IRON SATURATION: 30 % (ref 15–55)
IRON: 81 ug/dL (ref 38–169)
Total Iron Binding Capacity: 272 ug/dL (ref 250–450)
UIBC: 191 ug/dL (ref 111–343)

## 2015-05-12 LAB — HEPATIC FUNCTION PANEL
ALK PHOS: 68 IU/L (ref 39–117)
ALT: 30 IU/L (ref 0–44)
AST: 32 IU/L (ref 0–40)
Albumin: 4.5 g/dL (ref 3.6–4.8)
BILIRUBIN TOTAL: 0.4 mg/dL (ref 0.0–1.2)
BILIRUBIN, DIRECT: 0.12 mg/dL (ref 0.00–0.40)
Total Protein: 7.1 g/dL (ref 6.0–8.5)

## 2015-09-23 ENCOUNTER — Telehealth: Payer: Self-pay | Admitting: Family Medicine

## 2015-09-23 DIAGNOSIS — I10 Essential (primary) hypertension: Secondary | ICD-10-CM

## 2015-09-23 DIAGNOSIS — Z79899 Other long term (current) drug therapy: Secondary | ICD-10-CM

## 2015-09-23 DIAGNOSIS — Z125 Encounter for screening for malignant neoplasm of prostate: Secondary | ICD-10-CM

## 2015-09-23 NOTE — Telephone Encounter (Signed)
Notified patient that bloodwork has been ordered.  

## 2015-09-23 NOTE — Telephone Encounter (Signed)
Lipid, liver, metabolic 7, uric acid, PSA 

## 2015-09-23 NOTE — Telephone Encounter (Signed)
Pt is requesting lab orders to be sent over for an upcoming wellness visit. Last labs per epic were: tibc and hepatic on 05/11/15

## 2015-09-28 LAB — LIPID PANEL
CHOLESTEROL TOTAL: 231 mg/dL — AB (ref 100–199)
Chol/HDL Ratio: 5 ratio units (ref 0.0–5.0)
HDL: 46 mg/dL (ref >39)
LDL CALC: 159 mg/dL — AB (ref 0–99)
TRIGLYCERIDES: 130 mg/dL (ref 0–149)
VLDL Cholesterol Cal: 26 mg/dL (ref 5–40)

## 2015-09-28 LAB — URIC ACID: URIC ACID: 8.2 mg/dL (ref 3.7–8.6)

## 2015-09-28 LAB — HEPATIC FUNCTION PANEL
ALBUMIN: 4.5 g/dL (ref 3.6–4.8)
ALK PHOS: 73 IU/L (ref 39–117)
ALT: 17 IU/L (ref 0–44)
AST: 21 IU/L (ref 0–40)
BILIRUBIN, DIRECT: 0.16 mg/dL (ref 0.00–0.40)
Bilirubin Total: 0.7 mg/dL (ref 0.0–1.2)
Total Protein: 7 g/dL (ref 6.0–8.5)

## 2015-09-28 LAB — BASIC METABOLIC PANEL
BUN / CREAT RATIO: 16 (ref 10–24)
BUN: 17 mg/dL (ref 8–27)
CO2: 25 mmol/L (ref 18–29)
Calcium: 9.7 mg/dL (ref 8.6–10.2)
Chloride: 100 mmol/L (ref 96–106)
Creatinine, Ser: 1.08 mg/dL (ref 0.76–1.27)
GFR, EST AFRICAN AMERICAN: 83 mL/min/{1.73_m2} (ref >59)
GFR, EST NON AFRICAN AMERICAN: 72 mL/min/{1.73_m2} (ref >59)
Glucose: 90 mg/dL (ref 65–99)
POTASSIUM: 5.3 mmol/L — AB (ref 3.5–5.2)
SODIUM: 141 mmol/L (ref 134–144)

## 2015-09-28 LAB — PSA: Prostate Specific Ag, Serum: 1.4 ng/mL (ref 0.0–4.0)

## 2015-10-08 ENCOUNTER — Encounter: Payer: 59 | Admitting: Family Medicine

## 2015-10-09 ENCOUNTER — Encounter: Payer: Self-pay | Admitting: Family Medicine

## 2015-10-09 ENCOUNTER — Ambulatory Visit (INDEPENDENT_AMBULATORY_CARE_PROVIDER_SITE_OTHER): Payer: 59 | Admitting: Family Medicine

## 2015-10-09 VITALS — BP 140/86 | Ht 65.75 in | Wt 179.8 lb

## 2015-10-09 DIAGNOSIS — E785 Hyperlipidemia, unspecified: Secondary | ICD-10-CM | POA: Diagnosis not present

## 2015-10-09 DIAGNOSIS — K760 Fatty (change of) liver, not elsewhere classified: Secondary | ICD-10-CM

## 2015-10-09 DIAGNOSIS — IMO0001 Reserved for inherently not codable concepts without codable children: Secondary | ICD-10-CM

## 2015-10-09 DIAGNOSIS — Z1211 Encounter for screening for malignant neoplasm of colon: Secondary | ICD-10-CM | POA: Diagnosis not present

## 2015-10-09 DIAGNOSIS — I10 Essential (primary) hypertension: Secondary | ICD-10-CM

## 2015-10-09 DIAGNOSIS — R160 Hepatomegaly, not elsewhere classified: Secondary | ICD-10-CM

## 2015-10-09 DIAGNOSIS — Z0000 Encounter for general adult medical examination without abnormal findings: Secondary | ICD-10-CM | POA: Diagnosis not present

## 2015-10-09 MED ORDER — LISINOPRIL 10 MG PO TABS: 10.0000 mg | ORAL_TABLET | Freq: Every day | ORAL | 1 refills | Status: DC

## 2015-10-09 NOTE — Progress Notes (Signed)
Subjective:    Patient ID: Joseph Rodgers, male    DOB: 01-06-1951, 65 y.o.   MRN: [id number]  HPI AWV- Annual Wellness Visit  The patient was seen for their annual wellness visit. The patient's past medical history, surgical history, and family history were reviewed. Pertinent vaccines were reviewed ( tetanus, pneumonia, shingles, flu) The patient's medication list was reviewed and updated.  The height and weight were entered. The patient's current BMI is:29.2  Cognitive screening was completed. Outcome of Mini - Cog: pass  Falls within the past 6 months: none  Current tobacco usage: none (All patients who use tobacco were given written and verbal information on quitting)  Recent listing of emergency department/hospitalizations over the past year were reviewed.  current specialist the patient sees on a regular basis: none   Medicare annual wellness visit patient questionnaire was reviewed.  A written screening schedule for the patient for the next 5-10 years was given. Appropriate discussion of followup regarding next visit was discussed.   Patient noticed his blood pressure was running too low since losing weight and has cut his blood pressure pill in half and is doing great. Patient does not have any interest in having a colonoscopy. He is aware of the risk of colon cancer Patient does not want cholesterol medicines he just try them once before and cause health problems   Review of Systems  Constitutional: Negative for activity change, appetite change and fever.  HENT: Negative for congestion and rhinorrhea.   Eyes: Negative for discharge.  Respiratory: Negative for cough and wheezing.   Cardiovascular: Negative for chest pain.  Gastrointestinal: Negative for abdominal pain, blood in stool and vomiting.  Genitourinary: Negative for difficulty urinating and frequency.  Musculoskeletal: Negative for neck pain.  Skin: Negative for rash.  Allergic/Immunologic: Negative  for environmental allergies and food allergies.  Neurological: Negative for weakness and headaches.  Psychiatric/Behavioral: Negative for agitation.       Objective:   Physical Exam  Constitutional: He appears well-developed and well-nourished.  HENT:  Head: Normocephalic and atraumatic.  Right Ear: External ear normal.  Left Ear: External ear normal.  Nose: Nose normal.  Mouth/Throat: Oropharynx is clear and moist.  Eyes: EOM are normal. Pupils are equal, round, and reactive to light.  Neck: Normal range of motion. Neck supple. No thyromegaly present.  Cardiovascular: Normal rate, regular rhythm and normal heart sounds.   No murmur heard. Pulmonary/Chest: Effort normal and breath sounds normal. No respiratory distress. He has no wheezes.  Abdominal: Bowel sounds are normal. He exhibits mass. He exhibits no distension. There is no tenderness.   On physical on physical exam the patient has right upper quadrant fullness along with what appears to be a mass in the right upper quadrant it does not feel like the liver. It's approximately 4" x 5" but it is hard to discern because a moderate obesity no tenderness.  Genitourinary: Penis normal.  Musculoskeletal: Normal range of motion. He exhibits no edema.  Lymphadenopathy:    He has no cervical adenopathy.  Neurological: He is alert. He exhibits normal muscle tone.  Skin: Skin is warm and dry. No erythema.  Psychiatric: He has a normal mood and affect. His behavior is normal. Judgment normal.   Patients liver slightly enlarged.       Assessment & Plan:  Adult wellness-complete.wellness physical was conducted today. Importance of diet and exercise were discussed in detail. In addition to this a discussion regarding safety was also covered. We also  reviewed over immunizations and gave recommendations regarding current immunization needed for age. In addition to this additional areas were also touched on including: Preventative health  exams needed: Colonoscopy patient defers We will do stool testing for blood  Patient was advised yearly wellness exam Hyperlipidemia patient defers on any type of statin medicines he will watch his diet  HTN good control with lisinopril 10 mg daily follow-up in 6 months  Fatty liver patient is done a good job losing weight  Abdominal mass-right upper quadrant abdominal mass hard to tell if this is the liver recommend CT scan for further delineation patient had ultrasound done several months back

## 2015-10-12 NOTE — Addendum Note (Signed)
Addended by: Dairl Ponder on: 10/12/2015 03:35 PM   Modules accepted: Orders

## 2015-10-12 NOTE — Progress Notes (Signed)
Patient advised to pick up hemoccult cards at front desk. Patient verbalized understanding.

## 2015-10-22 ENCOUNTER — Other Ambulatory Visit: Payer: Self-pay | Admitting: *Deleted

## 2015-10-22 DIAGNOSIS — Z1211 Encounter for screening for malignant neoplasm of colon: Secondary | ICD-10-CM

## 2015-10-22 LAB — POC HEMOCCULT BLD/STL (HOME/3-CARD/SCREEN)
Card #3 Fecal Occult Blood, POC: NEGATIVE
FECAL OCCULT BLD: NEGATIVE
Fecal Occult Blood, POC: NEGATIVE

## 2015-10-23 ENCOUNTER — Ambulatory Visit (HOSPITAL_COMMUNITY)
Admission: RE | Admit: 2015-10-23 | Discharge: 2015-10-23 | Disposition: A | Discharge status: Home / Self Care | Payer: 59 | Source: Ambulatory Visit | Attending: Family Medicine | Admitting: Family Medicine

## 2015-10-23 DIAGNOSIS — I2510 Atherosclerotic heart disease of native coronary artery without angina pectoris: Secondary | ICD-10-CM | POA: Insufficient documentation

## 2015-10-23 DIAGNOSIS — I700 Atherosclerosis of aorta: Secondary | ICD-10-CM | POA: Diagnosis not present

## 2015-10-23 DIAGNOSIS — R160 Hepatomegaly, not elsewhere classified: Secondary | ICD-10-CM | POA: Diagnosis present

## 2015-10-23 MED ORDER — IOPAMIDOL (ISOVUE-300) INJECTION 61%
100.0000 mL | Freq: Once | INTRAVENOUS | Status: AC | PRN
  Administered 2015-10-23: 100 mL via INTRAVENOUS

## 2015-10-25 ENCOUNTER — Encounter: Payer: Self-pay | Admitting: Family Medicine

## 2015-10-25 DIAGNOSIS — I2510 Atherosclerotic heart disease of native coronary artery without angina pectoris: Secondary | ICD-10-CM | POA: Insufficient documentation

## 2015-11-13 ENCOUNTER — Telehealth: Payer: Self-pay | Admitting: Family Medicine

## 2015-11-13 NOTE — Telephone Encounter (Signed)
Patient was charged an OV and a physical for DOS: 10/09/15. He wanted to let you know that he is very upset how this was handled because he was not prepared for the additional charge.

## 2015-11-15 NOTE — Telephone Encounter (Signed)
Certainly it is my desire to have a good working relationship with my patients. I do not want this issue to be a source of stress with Joseph Rodgers. Offer to have [place] right off the additional cost of the office visit. Typically a form is given at the sign in of a wellness that explains that covering other items such as refills of chronic medications and checks of chronic health issues such as blood pressure cholesterol is not considered part of wellness. Let him know that in the future at the time of wellness we will focus only on wellness and separate office visit would need to occur for refills of high blood pressure medications or coverage of other chronic health issues.

## 2015-11-16 NOTE — Telephone Encounter (Signed)
There is nothing to write off.

## 2016-03-30 ENCOUNTER — Telehealth: Payer: Self-pay | Admitting: Family Medicine

## 2016-03-30 DIAGNOSIS — I10 Essential (primary) hypertension: Secondary | ICD-10-CM

## 2016-03-30 DIAGNOSIS — D696 Thrombocytopenia, unspecified: Secondary | ICD-10-CM

## 2016-03-30 DIAGNOSIS — Z79899 Other long term (current) drug therapy: Secondary | ICD-10-CM

## 2016-03-30 DIAGNOSIS — E785 Hyperlipidemia, unspecified: Secondary | ICD-10-CM

## 2016-03-30 NOTE — Telephone Encounter (Signed)
Orders ready. Pt notified.  

## 2016-03-30 NOTE — Addendum Note (Signed)
Addended by: Carmelina Noun on: 03/30/2016 01:54 PM   Modules accepted: Orders

## 2016-03-30 NOTE — Telephone Encounter (Signed)
Patient has 6 mth follow up on 2/26 and requesting lab work before appointment.

## 2016-03-30 NOTE — Telephone Encounter (Signed)
Patient had Lipid, Liver, Met 7, PSA, uric acid done 09/2015

## 2016-03-30 NOTE — Telephone Encounter (Signed)
Lipid, liver, metabolic 7, CBC-thrombocytopenia, hyperlipidemia, hypertension

## 2016-04-03 LAB — LIPID PANEL
Chol/HDL Ratio: 5 ratio units (ref 0.0–5.0)
Cholesterol, Total: 236 mg/dL — ABNORMAL HIGH (ref 100–199)
HDL: 47 mg/dL (ref >39)
LDL Calculated: 161 mg/dL — ABNORMAL HIGH (ref 0–99)
Triglycerides: 142 mg/dL (ref 0–149)
VLDL CHOLESTEROL CAL: 28 mg/dL (ref 5–40)

## 2016-04-03 LAB — CBC WITH DIFFERENTIAL/PLATELET
Basophils Absolute: 0 10*3/uL (ref 0.0–0.2)
Basos: 0 %
EOS (ABSOLUTE): 0.2 10*3/uL (ref 0.0–0.4)
Eos: 4 %
HEMATOCRIT: 46.8 % (ref 37.5–51.0)
HEMOGLOBIN: 16.7 g/dL (ref 13.0–17.7)
Immature Grans (Abs): 0 10*3/uL (ref 0.0–0.1)
Immature Granulocytes: 0 %
LYMPHS ABS: 1.4 10*3/uL (ref 0.7–3.1)
Lymphs: 28 %
MCH: 32.9 pg (ref 26.6–33.0)
MCHC: 35.7 g/dL (ref 31.5–35.7)
MCV: 92 fL (ref 79–97)
MONOCYTES: 9 %
Monocytes Absolute: 0.4 10*3/uL (ref 0.1–0.9)
NEUTROS ABS: 3 10*3/uL (ref 1.4–7.0)
Neutrophils: 59 %
Platelets: 153 10*3/uL (ref 150–379)
RBC: 5.08 x10E6/uL (ref 4.14–5.80)
RDW: 13.6 % (ref 12.3–15.4)
WBC: 5.1 10*3/uL (ref 3.4–10.8)

## 2016-04-03 LAB — BASIC METABOLIC PANEL
BUN / CREAT RATIO: 14 (ref 10–24)
BUN: 17 mg/dL (ref 8–27)
CHLORIDE: 101 mmol/L (ref 96–106)
CO2: 24 mmol/L (ref 18–29)
Calcium: 9.8 mg/dL (ref 8.6–10.2)
Creatinine, Ser: 1.2 mg/dL (ref 0.76–1.27)
GFR calc non Af Amer: 63 mL/min/{1.73_m2} (ref >59)
GFR, EST AFRICAN AMERICAN: 72 mL/min/{1.73_m2} (ref >59)
Glucose: 97 mg/dL (ref 65–99)
Potassium: 5 mmol/L (ref 3.5–5.2)
Sodium: 141 mmol/L (ref 134–144)

## 2016-04-03 LAB — HEPATIC FUNCTION PANEL
ALK PHOS: 63 IU/L (ref 39–117)
ALT: 24 IU/L (ref 0–44)
AST: 24 IU/L (ref 0–40)
Albumin: 4.7 g/dL (ref 3.6–4.8)
BILIRUBIN TOTAL: 0.8 mg/dL (ref 0.0–1.2)
BILIRUBIN, DIRECT: 0.14 mg/dL (ref 0.00–0.40)
Total Protein: 7.3 g/dL (ref 6.0–8.5)

## 2016-04-11 ENCOUNTER — Encounter: Payer: Self-pay | Admitting: Family Medicine

## 2016-04-11 ENCOUNTER — Ambulatory Visit (INDEPENDENT_AMBULATORY_CARE_PROVIDER_SITE_OTHER): Payer: Medicare Other | Admitting: Family Medicine

## 2016-04-11 VITALS — BP 140/92 | Ht 66.0 in | Wt 187.0 lb

## 2016-04-11 DIAGNOSIS — E785 Hyperlipidemia, unspecified: Secondary | ICD-10-CM

## 2016-04-11 DIAGNOSIS — I10 Essential (primary) hypertension: Secondary | ICD-10-CM

## 2016-04-11 MED ORDER — AMLODIPINE BESYLATE 2.5 MG PO TABS: 2.5000 mg | ORAL_TABLET | Freq: Every day | ORAL | 5 refills | Status: DC

## 2016-04-11 MED ORDER — LISINOPRIL 20 MG PO TABS: 20.0000 mg | ORAL_TABLET | Freq: Every day | ORAL | 5 refills | Status: DC

## 2016-04-11 NOTE — Progress Notes (Signed)
   Subjective:    Patient ID: Joseph Rodgers, male    DOB: 06-Apr-1950, 66 y.o.   MRN: [id number]  Hypertension  This is a chronic problem. The current episode started more than 1 year ago. Treatments tried: lisinopril. Compliance problems include exercise (diet is decent, has not exercised since it got cold).    Pt wants prescriptions printed out today.   Pt states no other concerns today besides bp has been running high.Patient relates as a upper blood pressure numbers been higher than what it normally is denies any chest tightness pressure pain or shortness of breath with it. Patient had recent lab work done which showed normal CBC kidney function showed cholesterol significantly elevated    Review of Systems Denies chest tightness pressure pain shortness breath swelling    Objective:   Physical Exam Lungs are clear no crackles heart regular pulse normal extremities no edema skin warm dry       Assessment & Plan:  Hypertension-blood pressures not quite where 1 and to be patient needs to be more physically active watch diet closer I recommend changing medication increase lisinopril 20 mg daily-patient has been doing this recently-in addition to this amlodipine 2.5 mg 1 daily patient will check blood pressure periodically: Is to see 130/80 or less if possible  Hyperlipidemia patient chooses to try red yeast rice extract and to follow-up if ongoing troubles otherwise he will repeat lab work again later this summer wellness later this summer

## 2016-05-09 ENCOUNTER — Encounter: Payer: Self-pay | Admitting: Family Medicine

## 2016-05-09 ENCOUNTER — Ambulatory Visit (INDEPENDENT_AMBULATORY_CARE_PROVIDER_SITE_OTHER): Payer: Medicare Other | Admitting: Family Medicine

## 2016-05-09 VITALS — BP 124/66 | Temp 98.7°F | Ht 66.0 in | Wt 195.1 lb

## 2016-05-09 DIAGNOSIS — E785 Hyperlipidemia, unspecified: Secondary | ICD-10-CM | POA: Diagnosis not present

## 2016-05-09 DIAGNOSIS — L509 Urticaria, unspecified: Secondary | ICD-10-CM | POA: Diagnosis not present

## 2016-05-09 DIAGNOSIS — K760 Fatty (change of) liver, not elsewhere classified: Secondary | ICD-10-CM

## 2016-05-09 DIAGNOSIS — I10 Essential (primary) hypertension: Secondary | ICD-10-CM

## 2016-05-09 MED ORDER — PREDNISONE 20 MG PO TABS: ORAL_TABLET | ORAL | 0 refills | Status: DC

## 2016-05-09 MED ORDER — RANITIDINE HCL 300 MG PO TABS: ORAL_TABLET | ORAL | 0 refills | Status: DC

## 2016-05-09 NOTE — Progress Notes (Signed)
   Subjective:    Patient ID: Joseph Rodgers, male    DOB: 12-30-1950, 66 y.o.   MRN: [id number]  Rash  This is a new problem. The current episode started in the past 7 days. The rash is diffuse. The rash is characterized by redness and itchiness. It is unknown if there was an exposure to a precipitant.   Rah came on fairly suddenly this weekend  Splotches sudden, and vry itchy, feet and hand  Werevery itchy and een a little swollen  Took benadryl and now taking one evey three or four hsrs    Used a different ytype of sbidy spray   Patient would like to discuss red yeast and fatty liver.  Review of Systems  Skin: Positive for rash.  No fever no chills no shortness of breath no wheezing     Objective:   Physical Exam  Alert vitals stable, NAD. Blood pressure good on repeat. HEENT normal. Lungs clear. Heart regular rate and rhythm. Skin reveals multiple patches of urticaria erythematous warm raised edges in places      Assessment & Plan:  Impression urticarial reaction discussed at length no clear etiology. No evidence of airway compromise #2 hypertension blood pressure improved on the dose discussed maintain same #3 review chart at patient's request elevated liver enzymes in the past with probable fatty liver improved with diet to completely normal liver enzymes. Patient had wondered if he could take red yeast rice extract with liver history and I stated yes. History of statin intolerance discussed  Initiate prednisone taper. Side effects benefits discussed. Zantac 2 weeks. Hold off on allergy workup. Maintain Benadryl.  Greater than 50% of this 25 minute face to face visit was spent in counseling and discussion and coordination of care regarding the above diagnosis/diagnosies

## 2016-06-07 ENCOUNTER — Other Ambulatory Visit: Payer: Self-pay | Admitting: *Deleted

## 2016-06-07 MED ORDER — LISINOPRIL 20 MG PO TABS: 20.0000 mg | ORAL_TABLET | Freq: Every day | ORAL | 1 refills | Status: DC

## 2016-09-19 ENCOUNTER — Encounter: Payer: Self-pay | Admitting: Family Medicine

## 2016-09-19 ENCOUNTER — Ambulatory Visit (INDEPENDENT_AMBULATORY_CARE_PROVIDER_SITE_OTHER): Payer: Medicare Other | Admitting: Family Medicine

## 2016-09-19 VITALS — BP 138/76 | Ht 66.0 in | Wt 194.0 lb

## 2016-09-19 DIAGNOSIS — E784 Other hyperlipidemia: Secondary | ICD-10-CM | POA: Diagnosis not present

## 2016-09-19 DIAGNOSIS — I10 Essential (primary) hypertension: Secondary | ICD-10-CM | POA: Diagnosis not present

## 2016-09-19 DIAGNOSIS — S46911A Strain of unspecified muscle, fascia and tendon at shoulder and upper arm level, right arm, initial encounter: Secondary | ICD-10-CM | POA: Diagnosis not present

## 2016-09-19 DIAGNOSIS — M1000 Idiopathic gout, unspecified site: Secondary | ICD-10-CM | POA: Diagnosis not present

## 2016-09-19 DIAGNOSIS — E7849 Other hyperlipidemia: Secondary | ICD-10-CM

## 2016-09-19 DIAGNOSIS — Z125 Encounter for screening for malignant neoplasm of prostate: Secondary | ICD-10-CM

## 2016-09-19 MED ORDER — RANITIDINE HCL 300 MG PO TABS: ORAL_TABLET | ORAL | 0 refills | Status: DC

## 2016-09-19 MED ORDER — AMLODIPINE BESYLATE 2.5 MG PO TABS: 2.5000 mg | ORAL_TABLET | Freq: Every day | ORAL | 1 refills | Status: DC

## 2016-09-19 MED ORDER — PREDNISONE 20 MG PO TABS: ORAL_TABLET | ORAL | 0 refills | Status: DC

## 2016-09-19 MED ORDER — LISINOPRIL 20 MG PO TABS: 20.0000 mg | ORAL_TABLET | Freq: Every day | ORAL | 1 refills | Status: DC

## 2016-09-19 MED ORDER — DICLOFENAC SODIUM 75 MG PO TBEC: 75.0000 mg | DELAYED_RELEASE_TABLET | Freq: Two times a day (BID) | ORAL | 0 refills | Status: DC

## 2016-09-19 NOTE — Progress Notes (Signed)
   Subjective:    Patient ID: Joseph Rodgers, male    DOB: 14-Nov-1950, 66 y.o.   MRN: [id number]  HPI Patient arrives with c/o right shoulder pain since the weekend. Patient reports no known injury just did a lot of physical labor this weekend. Patient relates a lot of stiffness in his shoulder difficult time moving it. Hurts were certain movements Takes his blood pressure medicine regular basis does need refills Denies any chest tightness pressure pain shortness breath high fever chills nausea vomiting diarrhea. Denies any numbness of treating willing radiating down the arm  Review of Systems See above    Objective:   Physical Exam The right side neck there is minimal tenderness there I don't feel this is related Shoulder there is some tendinitis in the shoulder noted Lungs clear hearts regular Blood pressure good       Assessment & Plan:  Refill blood pressure medicine  Shoulder tendinitis range of motion neck sizes shown pamphlet given prednisone taper anti-inflammatory if necessary Zantac while on the prednisone  Follow-up for wellness in a few weeks lab work ordered

## 2016-09-19 NOTE — Patient Instructions (Signed)

## 2016-09-25 ENCOUNTER — Other Ambulatory Visit: Payer: Self-pay | Admitting: Family Medicine

## 2016-09-30 DIAGNOSIS — I10 Essential (primary) hypertension: Secondary | ICD-10-CM | POA: Diagnosis not present

## 2016-09-30 DIAGNOSIS — Z125 Encounter for screening for malignant neoplasm of prostate: Secondary | ICD-10-CM | POA: Diagnosis not present

## 2016-09-30 DIAGNOSIS — M1000 Idiopathic gout, unspecified site: Secondary | ICD-10-CM | POA: Diagnosis not present

## 2016-09-30 DIAGNOSIS — E784 Other hyperlipidemia: Secondary | ICD-10-CM | POA: Diagnosis not present

## 2016-10-01 ENCOUNTER — Encounter: Payer: Self-pay | Admitting: Family Medicine

## 2016-10-01 LAB — HEPATIC FUNCTION PANEL
ALT: 20 IU/L (ref 0–44)
AST: 22 IU/L (ref 0–40)
Albumin: 4.8 g/dL (ref 3.6–4.8)
Alkaline Phosphatase: 69 IU/L (ref 39–117)
BILIRUBIN, DIRECT: 0.21 mg/dL (ref 0.00–0.40)
Bilirubin Total: 1 mg/dL (ref 0.0–1.2)
TOTAL PROTEIN: 7.3 g/dL (ref 6.0–8.5)

## 2016-10-01 LAB — LIPID PANEL
CHOL/HDL RATIO: 5.1 ratio — AB (ref 0.0–5.0)
Cholesterol, Total: 236 mg/dL — ABNORMAL HIGH (ref 100–199)
HDL: 46 mg/dL (ref >39)
LDL Calculated: 156 mg/dL — ABNORMAL HIGH (ref 0–99)
TRIGLYCERIDES: 172 mg/dL — AB (ref 0–149)
VLDL CHOLESTEROL CAL: 34 mg/dL (ref 5–40)

## 2016-10-01 LAB — BASIC METABOLIC PANEL
BUN / CREAT RATIO: 17 (ref 10–24)
BUN: 18 mg/dL (ref 8–27)
CHLORIDE: 102 mmol/L (ref 96–106)
CO2: 23 mmol/L (ref 20–29)
Calcium: 9.6 mg/dL (ref 8.6–10.2)
Creatinine, Ser: 1.09 mg/dL (ref 0.76–1.27)
GFR calc Af Amer: 81 mL/min/{1.73_m2} (ref >59)
GFR calc non Af Amer: 70 mL/min/{1.73_m2} (ref >59)
GLUCOSE: 96 mg/dL (ref 65–99)
Potassium: 4.9 mmol/L (ref 3.5–5.2)
SODIUM: 140 mmol/L (ref 134–144)

## 2016-10-01 LAB — URIC ACID: URIC ACID: 7.3 mg/dL (ref 3.7–8.6)

## 2016-10-01 LAB — PSA: PROSTATE SPECIFIC AG, SERUM: 2.1 ng/mL (ref 0.0–4.0)

## 2016-10-10 ENCOUNTER — Ambulatory Visit (INDEPENDENT_AMBULATORY_CARE_PROVIDER_SITE_OTHER): Payer: Medicare Other | Admitting: Family Medicine

## 2016-10-10 ENCOUNTER — Encounter: Payer: Self-pay | Admitting: Family Medicine

## 2016-10-10 VITALS — BP 138/74 | Ht 66.0 in | Wt 194.0 lb

## 2016-10-10 DIAGNOSIS — Z0000 Encounter for general adult medical examination without abnormal findings: Secondary | ICD-10-CM

## 2016-10-10 DIAGNOSIS — Z23 Encounter for immunization: Secondary | ICD-10-CM | POA: Diagnosis not present

## 2016-10-10 DIAGNOSIS — E785 Hyperlipidemia, unspecified: Secondary | ICD-10-CM

## 2016-10-10 DIAGNOSIS — Z1211 Encounter for screening for malignant neoplasm of colon: Secondary | ICD-10-CM

## 2016-10-10 MED ORDER — ZOSTER VAC RECOMB ADJUVANTED 50 MCG/0.5ML IM SUSR: 0.5000 mL | Freq: Once | INTRAMUSCULAR | 1 refills | Status: AC

## 2016-10-10 NOTE — Progress Notes (Signed)
   Subjective:    Patient ID: Joseph Rodgers, male    DOB: [birth year], 66 y.o.   MRN: [id number]  HPI  AWV- Annual Wellness Visit  The patient was seen for their annual wellness visit. The patient's past medical history, surgical history, and family history were reviewed. Pertinent vaccines were reviewed ( tetanus, pneumonia, shingles, flu) The patient's medication list was reviewed and updated.  The height and weight were entered. The patient's current BMI is:   Cognitive screening was completed. Outcome of Mini - Cog:   Falls within the past 6 months: None   Current tobacco usage: None  (All patients who use tobacco were given written and verbal information on quitting)  Recent listing of emergency department/hospitalizations over the past year were reviewed.  current specialist the patient sees on a regular basis: None    Medicare annual wellness visit patient questionnaire was reviewed.  A written screening schedule for the patient for the next 5-10 years was given. Appropriate discussion of followup regarding next visit was discussed.   Patient states no concerns this visit.   Review of Systems  Constitutional: Negative for activity change, appetite change and fever.  HENT: Negative for congestion and rhinorrhea.   Eyes: Negative for discharge.  Respiratory: Negative for cough and wheezing.   Cardiovascular: Negative for chest pain.  Gastrointestinal: Negative for abdominal pain, blood in stool and vomiting.  Genitourinary: Negative for difficulty urinating and frequency.  Musculoskeletal: Negative for neck pain.  Skin: Negative for rash.  Allergic/Immunologic: Negative for environmental allergies and food allergies.  Neurological: Negative for weakness and headaches.  Psychiatric/Behavioral: Negative for agitation.       Objective:   Physical Exam  Constitutional: He appears well-developed and well-nourished.  HENT:  Head: Normocephalic and atraumatic.    Right Ear: External ear normal.  Left Ear: External ear normal.  Nose: Nose normal.  Mouth/Throat: Oropharynx is clear and moist.  Eyes: Pupils are equal, round, and reactive to light. EOM are normal.  Neck: Normal range of motion. Neck supple. No thyromegaly present.  Cardiovascular: Normal rate, regular rhythm and normal heart sounds.   No murmur heard. Pulmonary/Chest: Effort normal and breath sounds normal. No respiratory distress. He has no wheezes.  Abdominal: Soft. Bowel sounds are normal. He exhibits no distension and no mass. There is no tenderness.  Genitourinary: Penis normal.  Musculoskeletal: Normal range of motion. He exhibits no edema.  Lymphadenopathy:    He has no cervical adenopathy.  Neurological: He is alert. He exhibits normal muscle tone.  Skin: Skin is warm and dry. No erythema.  Psychiatric: He has a normal mood and affect. His behavior is normal. Judgment normal.    Prostate exam normal      Assessment & Plan:  Adult wellness-complete.wellness physical was conducted today. Importance of diet and exercise were discussed in detail. In addition to this a discussion regarding safety was also covered. We also reviewed over immunizations and gave recommendations regarding current immunization needed for age. In addition to this additional areas were also touched on including: Preventative health exams needed: Colonoscopy declined, to do [facility]  Patient was advised yearly wellness exam Patient will take red rice yeast extract for his elevated cholesterol. He'll recheck lipid liver in 3 months. Follow-up here 6 months

## 2016-10-20 ENCOUNTER — Other Ambulatory Visit: Payer: Self-pay

## 2016-10-20 DIAGNOSIS — Z1211 Encounter for screening for malignant neoplasm of colon: Secondary | ICD-10-CM

## 2016-10-20 LAB — IFOBT (OCCULT BLOOD): IMMUNOLOGICAL FECAL OCCULT BLOOD TEST: NEGATIVE

## 2016-11-17 DIAGNOSIS — Z23 Encounter for immunization: Secondary | ICD-10-CM | POA: Diagnosis not present

## 2016-11-24 ENCOUNTER — Other Ambulatory Visit: Payer: Self-pay | Admitting: Family Medicine

## 2017-03-14 ENCOUNTER — Ambulatory Visit (INDEPENDENT_AMBULATORY_CARE_PROVIDER_SITE_OTHER): Payer: Medicare Other | Admitting: Family Medicine

## 2017-03-14 ENCOUNTER — Encounter: Payer: Self-pay | Admitting: Family Medicine

## 2017-03-14 VITALS — BP 160/98 | Ht 66.0 in | Wt 203.0 lb

## 2017-03-14 DIAGNOSIS — I10 Essential (primary) hypertension: Secondary | ICD-10-CM

## 2017-03-14 DIAGNOSIS — G542 Cervical root disorders, not elsewhere classified: Secondary | ICD-10-CM

## 2017-03-14 MED ORDER — NAPROXEN 500 MG PO TABS: 500.0000 mg | ORAL_TABLET | Freq: Two times a day (BID) | ORAL | 1 refills | Status: DC

## 2017-03-14 MED ORDER — AMLODIPINE BESYLATE 2.5 MG PO TABS: 2.5000 mg | ORAL_TABLET | Freq: Every day | ORAL | 5 refills | Status: DC

## 2017-03-14 MED ORDER — LISINOPRIL 20 MG PO TABS: 20.0000 mg | ORAL_TABLET | Freq: Every day | ORAL | 1 refills | Status: DC

## 2017-03-14 NOTE — Patient Instructions (Signed)
DASH Eating Plan DASH stands for "Dietary Approaches to Stop Hypertension." The DASH eating plan is a healthy eating plan that has been shown to reduce high blood pressure (hypertension). It may also reduce your risk for type 2 diabetes, heart disease, and stroke. The DASH eating plan may also help with weight loss. What are tips for following this plan? General guidelines  Avoid eating more than 2,300 mg (milligrams) of salt (sodium) a day. If you have hypertension, you may need to reduce your sodium intake to 1,500 mg a day.  Limit alcohol intake to no more than 1 drink a day for nonpregnant women and 2 drinks a day for men. One drink equals 12 oz of beer, 5 oz of wine, or 1 oz of hard liquor.  Work with your health care provider to maintain a healthy body weight or to lose weight. Ask what an ideal weight is for you.  Get at least 30 minutes of exercise that causes your heart to beat faster (aerobic exercise) most days of the week. Activities may include walking, swimming, or biking.  Work with your health care provider or diet and nutrition specialist (dietitian) to adjust your eating plan to your individual calorie needs. Reading food labels  Check food labels for the amount of sodium per serving. Choose foods with less than 5 percent of the Daily Value of sodium. Generally, foods with less than 300 mg of sodium per serving fit into this eating plan.  To find whole grains, look for the word "whole" as the first word in the ingredient list. Shopping  Buy products labeled as "low-sodium" or "no salt added."  Buy fresh foods. Avoid canned foods and premade or frozen meals. Cooking  Avoid adding salt when cooking. Use salt-free seasonings or herbs instead of table salt or sea salt. Check with your health care provider or pharmacist before using salt substitutes.  Do not fry foods. Cook foods using healthy methods such as baking, boiling, grilling, and broiling instead.  Cook with  heart-healthy oils, such as olive, canola, soybean, or sunflower oil. Meal planning   Eat a balanced diet that includes: ? 5 or more servings of fruits and vegetables each day. At each meal, try to fill half of your plate with fruits and vegetables. ? Up to 6-8 servings of whole grains each day. ? Less than 6 oz of lean meat, poultry, or fish each day. A 3-oz serving of meat is about the same size as a deck of cards. One egg equals 1 oz. ? 2 servings of low-fat dairy each day. ? A serving of nuts, seeds, or beans 5 times each week. ? Heart-healthy fats. Healthy fats called Omega-3 fatty acids are found in foods such as flaxseeds and coldwater fish, like sardines, salmon, and mackerel.  Limit how much you eat of the following: ? Canned or prepackaged foods. ? Food that is high in trans fat, such as fried foods. ? Food that is high in saturated fat, such as fatty meat. ? Sweets, desserts, sugary drinks, and other foods with added sugar. ? Full-fat dairy products.  Do not salt foods before eating.  Try to eat at least 2 vegetarian meals each week.  Eat more home-cooked food and less restaurant, buffet, and fast food.  When eating at a restaurant, ask that your food be prepared with less salt or no salt, if possible. What foods are recommended? The items listed may not be a complete list. Talk with your dietitian about what   dietary choices are best for you. Grains Whole-grain or whole-wheat bread. Whole-grain or whole-wheat pasta. Brown rice. Oatmeal. Quinoa. Bulgur. Whole-grain and low-sodium cereals. Pita bread. Low-fat, low-sodium crackers. Whole-wheat flour tortillas. Vegetables Fresh or frozen vegetables (raw, steamed, roasted, or grilled). Low-sodium or reduced-sodium tomato and vegetable juice. Low-sodium or reduced-sodium tomato sauce and tomato paste. Low-sodium or reduced-sodium canned vegetables. Fruits All fresh, dried, or frozen fruit. Canned fruit in natural juice (without  added sugar). Meat and other protein foods Skinless chicken or turkey. Ground chicken or turkey. Pork with fat trimmed off. Fish and seafood. Egg whites. Dried beans, peas, or lentils. Unsalted nuts, nut butters, and seeds. Unsalted canned beans. Lean cuts of beef with fat trimmed off. Low-sodium, lean deli meat. Dairy Low-fat (1%) or fat-free (skim) milk. Fat-free, low-fat, or reduced-fat cheeses. Nonfat, low-sodium ricotta or cottage cheese. Low-fat or nonfat yogurt. Low-fat, low-sodium cheese. Fats and oils Soft margarine without trans fats. Vegetable oil. Low-fat, reduced-fat, or light mayonnaise and salad dressings (reduced-sodium). Canola, safflower, olive, soybean, and sunflower oils. Avocado. Seasoning and other foods Herbs. Spices. Seasoning mixes without salt. Unsalted popcorn and pretzels. Fat-free sweets. What foods are not recommended? The items listed may not be a complete list. Talk with your dietitian about what dietary choices are best for you. Grains Baked goods made with fat, such as croissants, muffins, or some breads. Dry pasta or rice meal packs. Vegetables Creamed or fried vegetables. Vegetables in a cheese sauce. Regular canned vegetables (not low-sodium or reduced-sodium). Regular canned tomato sauce and paste (not low-sodium or reduced-sodium). Regular tomato and vegetable juice (not low-sodium or reduced-sodium). Pickles. Olives. Fruits Canned fruit in a light or heavy syrup. Fried fruit. Fruit in cream or butter sauce. Meat and other protein foods Fatty cuts of meat. Ribs. Fried meat. Bacon. Sausage. Bologna and other processed lunch meats. Salami. Fatback. Hotdogs. Bratwurst. Salted nuts and seeds. Canned beans with added salt. Canned or smoked fish. Whole eggs or egg yolks. Chicken or turkey with skin. Dairy Whole or 2% milk, cream, and half-and-half. Whole or full-fat cream cheese. Whole-fat or sweetened yogurt. Full-fat cheese. Nondairy creamers. Whipped toppings.  Processed cheese and cheese spreads. Fats and oils Butter. Stick margarine. Lard. Shortening. Ghee. Bacon fat. Tropical oils, such as coconut, palm kernel, or palm oil. Seasoning and other foods Salted popcorn and pretzels. Onion salt, garlic salt, seasoned salt, table salt, and sea salt. Worcestershire sauce. Tartar sauce. Barbecue sauce. Teriyaki sauce. Soy sauce, including reduced-sodium. Steak sauce. Canned and packaged gravies. Fish sauce. Oyster sauce. Cocktail sauce. Horseradish that you find on the shelf. Ketchup. Mustard. Meat flavorings and tenderizers. Bouillon cubes. Hot sauce and Tabasco sauce. Premade or packaged marinades. Premade or packaged taco seasonings. Relishes. Regular salad dressings. Where to find more information:  National Heart, Lung, and Blood Institute: www.nhlbi.nih.gov  American Heart Association: www.heart.org Summary  The DASH eating plan is a healthy eating plan that has been shown to reduce high blood pressure (hypertension). It may also reduce your risk for type 2 diabetes, heart disease, and stroke.  With the DASH eating plan, you should limit salt (sodium) intake to 2,300 mg a day. If you have hypertension, you may need to reduce your sodium intake to 1,500 mg a day.  When on the DASH eating plan, aim to eat more fresh fruits and vegetables, whole grains, lean proteins, low-fat dairy, and heart-healthy fats.  Work with your health care provider or diet and nutrition specialist (dietitian) to adjust your eating plan to your individual   calorie needs. This information is not intended to replace advice given to you by your health care provider. Make sure you discuss any questions you have with your health care provider. Document Released: 01/20/2011 Document Revised: 01/25/2016 Document Reviewed: 01/25/2016 Elsevier Interactive Patient Education  2018 [employer]  

## 2017-03-14 NOTE — Progress Notes (Signed)
   Subjective:    Patient ID: Joseph Rodgers, male    DOB: 1950-06-26, 67 y.o.   MRN: [id number]  Neck Pain   This is a new problem. Episode onset: 2 months ago. The pain is present in the right side. Pertinent negatives include no chest pain, headaches or weakness. Treatments tried: prednisone, diclofenac.  Significant discomfort into the right trapezius.  Denies any injury problem.  Denies sweats chills fever. No tingling in the hand.  No weakness    Review of Systems  Constitutional: Negative for activity change, appetite change and fatigue.  HENT: Negative for congestion and rhinorrhea.   Respiratory: Negative for cough, chest tightness and shortness of breath.   Cardiovascular: Negative for chest pain and leg swelling.  Gastrointestinal: Negative for abdominal pain, diarrhea and nausea.  Endocrine: Negative for polydipsia and polyphagia.  Genitourinary: Negative for dysuria and hematuria.  Musculoskeletal: Positive for neck pain.  Neurological: Negative for weakness and headaches.  Psychiatric/Behavioral: Negative for confusion and dysphoric mood.  Strength is normal in the arms reflexes normal.     Objective:   Physical Exam  Constitutional: He appears well-nourished. No distress.  HENT:  Head: Normocephalic and atraumatic.  Eyes: Right eye exhibits no discharge. Left eye exhibits no discharge.  Neck: No tracheal deviation present.  Cardiovascular: Normal rate, regular rhythm and normal heart sounds.  No murmur heard. Pulmonary/Chest: Effort normal and breath sounds normal. No respiratory distress. He has no wheezes.  Musculoskeletal: He exhibits no edema.  Lymphadenopathy:    He has no cervical adenopathy.  Neurological: He is alert.  Skin: Skin is warm. No rash noted.  Psychiatric: His behavior is normal.  Vitals reviewed.   Strength normal in the arms reflexes normal      Assessment & Plan:  Trapezius pain-stretching exercises shown more than likely a nerve  impingement should gradually get better range of motion exercises recommended  Blood pressure stable watch diet closely take medications

## 2017-04-11 ENCOUNTER — Ambulatory Visit (INDEPENDENT_AMBULATORY_CARE_PROVIDER_SITE_OTHER): Payer: Medicare Other | Admitting: Family Medicine

## 2017-04-11 ENCOUNTER — Encounter: Payer: Self-pay | Admitting: Family Medicine

## 2017-04-11 VITALS — BP 146/72 | Ht 66.0 in | Wt 203.0 lb

## 2017-04-11 DIAGNOSIS — L821 Other seborrheic keratosis: Secondary | ICD-10-CM | POA: Insufficient documentation

## 2017-04-11 DIAGNOSIS — I10 Essential (primary) hypertension: Secondary | ICD-10-CM | POA: Diagnosis not present

## 2017-04-11 NOTE — Progress Notes (Signed)
   Subjective:    Patient ID: Joseph Rodgers, male    DOB: Apr 04, 1950, 67 y.o.   MRN: [id number]  HPIFollow up on neck pain. Pt states he is doing much better.  He denies any severe pain down the arm denies any weakness denies any bad numbness. Bump on back pt wants looked at.   Has multiple seborrheic keratosis on his back he would like to have them checked 1 of them got irritated when he was scratching it Pt brought in blood pressure readings.  Multiple blood pressure readings the vast majority look very good he is trying to stay healthy and stay active to some degree difficult because of the weather Review of Systems  Constitutional: Negative for activity change, appetite change and fatigue.  HENT: Negative for congestion and rhinorrhea.   Respiratory: Negative for cough, chest tightness and shortness of breath.   Cardiovascular: Negative for chest pain and leg swelling.  Gastrointestinal: Negative for abdominal pain, diarrhea and nausea.  Endocrine: Negative for polydipsia and polyphagia.  Genitourinary: Negative for dysuria and hematuria.  Neurological: Negative for weakness and headaches.  Psychiatric/Behavioral: Negative for confusion and dysphoric mood.       Objective:   Physical Exam  Constitutional: He appears well-nourished. No distress.  HENT:  Head: Normocephalic and atraumatic.  Eyes: Right eye exhibits no discharge. Left eye exhibits no discharge.  Neck: No tracheal deviation present.  Cardiovascular: Normal rate, regular rhythm and normal heart sounds.  No murmur heard. Pulmonary/Chest: Effort normal and breath sounds normal. No respiratory distress. He has no wheezes.  Musculoskeletal: He exhibits no edema.  Lymphadenopathy:    He has no cervical adenopathy.  Neurological: He is alert.  Skin: Skin is warm. No Rodgers noted.  Psychiatric: His behavior is normal.  Vitals reviewed.         Assessment & Plan:  HTN- Patient was seen today as part of a visit  regarding hypertension. The importance of healthy diet and regular physical activity was discussed. The importance of compliance with medications discussed. Ideal goal is to keep blood pressure low elevated levels certainly below 916/38 when possible. The patient was counseled that keeping blood pressure under control lessen his risk of heart attack, stroke, kidney failure, and early death. The importance of regular follow-ups was discussed with the patient. Low-salt diet such as DASH recommended. Regular physical activity was recommended as well. Patient was advised to keep regular follow-ups. Blood pressure under fairly good control he will send [place] more readings in the several weeks time  Nerve impingement in the neck actually doing better no need for any type of follow-up with this  Comprehensive checkup in August  Seborrheic keratosis appear normal not cancerous monitor intermittently

## 2017-04-25 ENCOUNTER — Other Ambulatory Visit: Payer: Self-pay | Admitting: Family Medicine

## 2017-05-07 IMAGING — CT CT ABDOMEN W/ CM
2 of 5 series · 15 of 46 positions shown, 17 images · IV contrast (iopamidol)
Comparison: 04/17/2015 abdominal ultrasound. 04/16/2010 abdominal
pelvic CT.

CLINICAL DATA: Hepatomegaly." Upper abdominal hardness" .
Appendectomy. Kidney stones.

EXAM:
CT ABDOMEN WITH CONTRAST
TECHNIQUE: Multidetector CT imaging of the abdomen was performed using the
standard protocol following bolus administration of intravenous
contrast.
CONTRAST:  100mL YL38AS-TLL IOPAMIDOL (YL38AS-TLL) INJECTION 61%

[Series 2: routine abd pel with · axial · 0.75mm/px · z∈[-260,-20]mm · 12 of 58 slices shown, 14 images]
[im 5/58  soft-tissue]
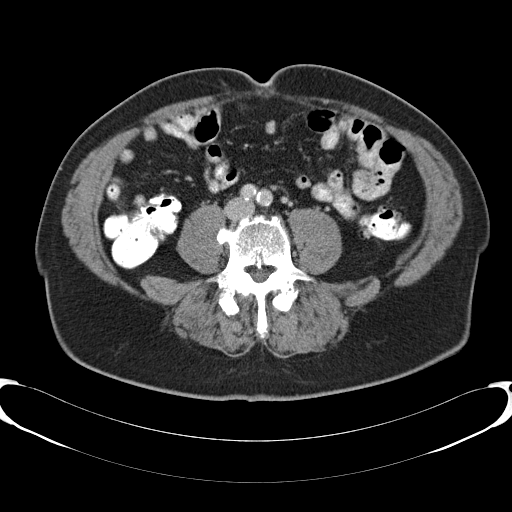
[im 5/58  bone]
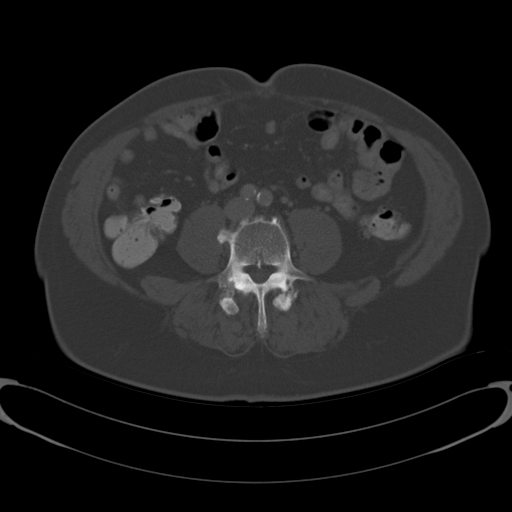
[im 9/58  soft-tissue]
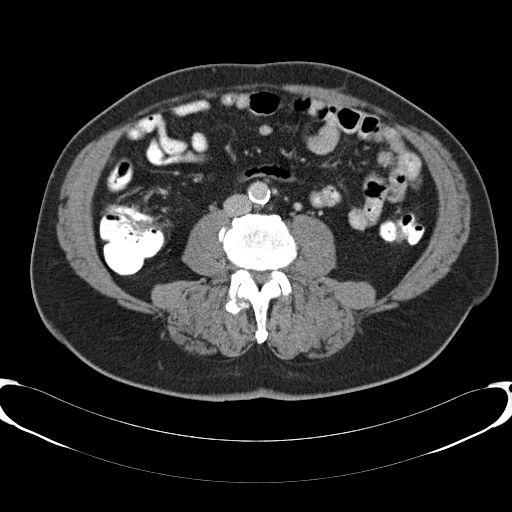
[im 14/58  soft-tissue]
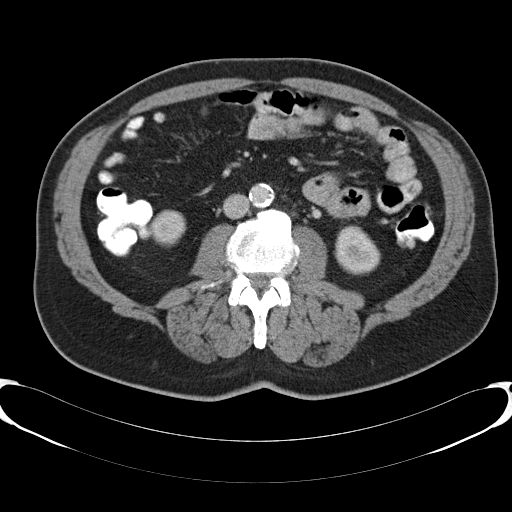
[im 18/58  soft-tissue]
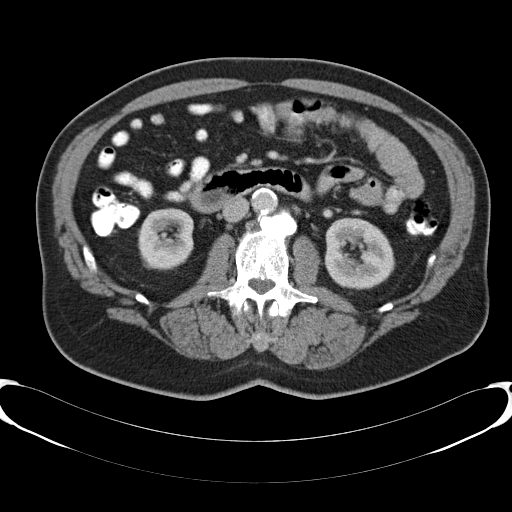
[im 22/58  soft-tissue]
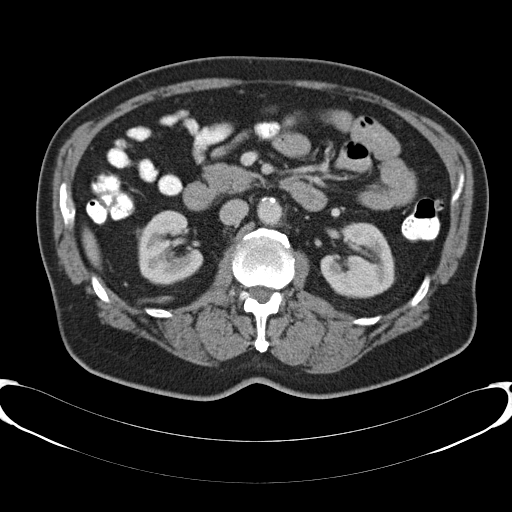
[im 27/58  soft-tissue]
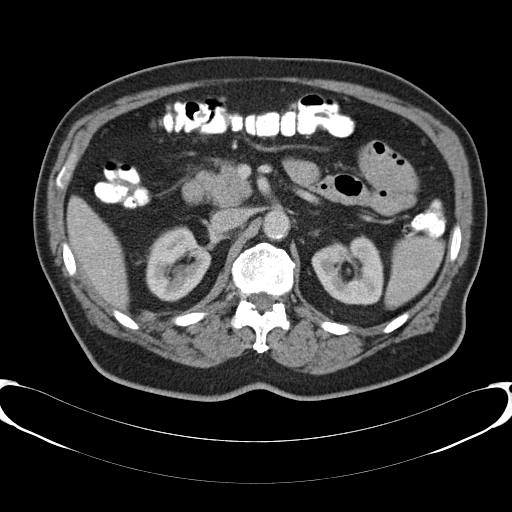
[im 31/58  soft-tissue]
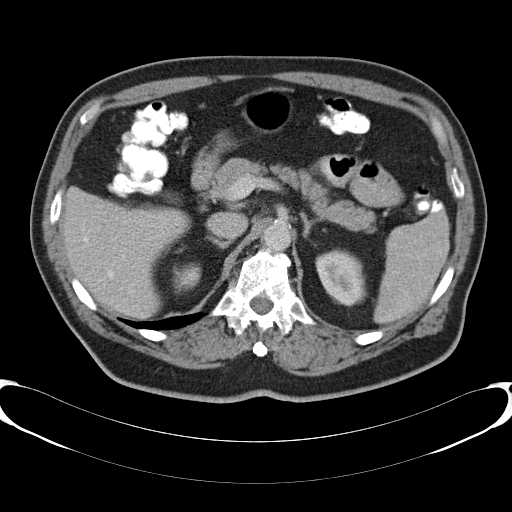
[im 36/58  soft-tissue]
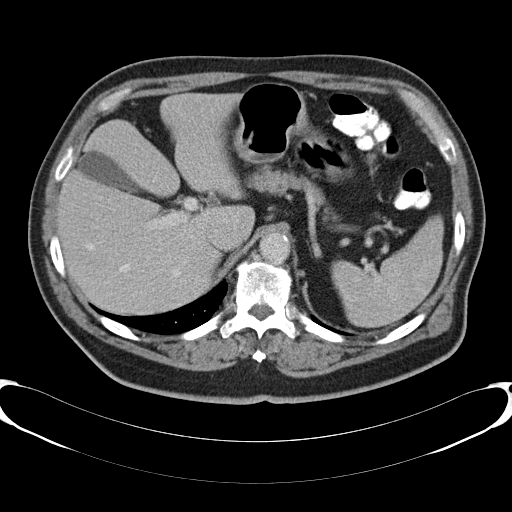
[im 40/58  soft-tissue]
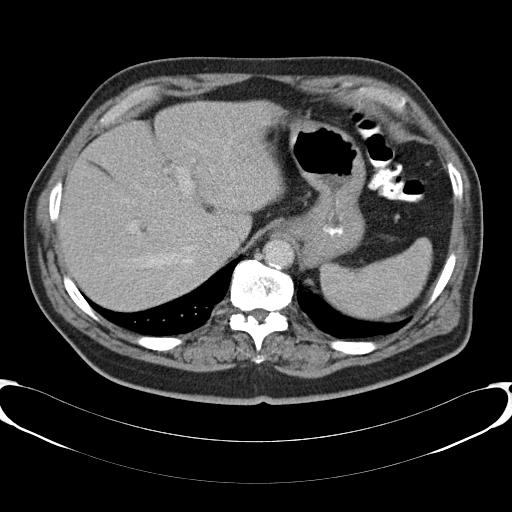
[im 40/58  bone]
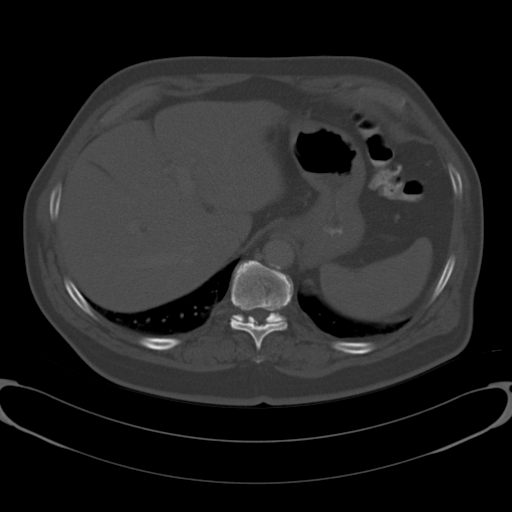
[im 44/58  soft-tissue]
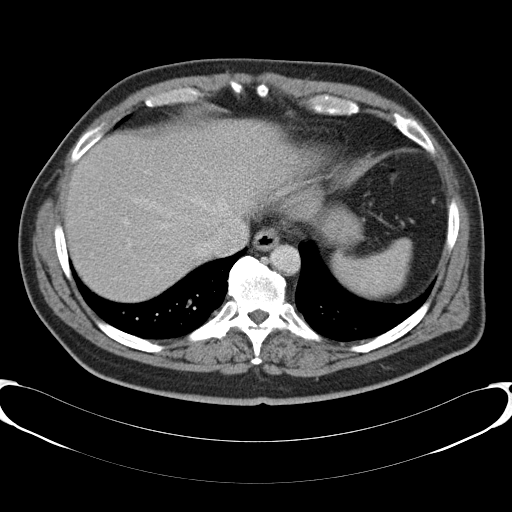
[im 49/58  soft-tissue]
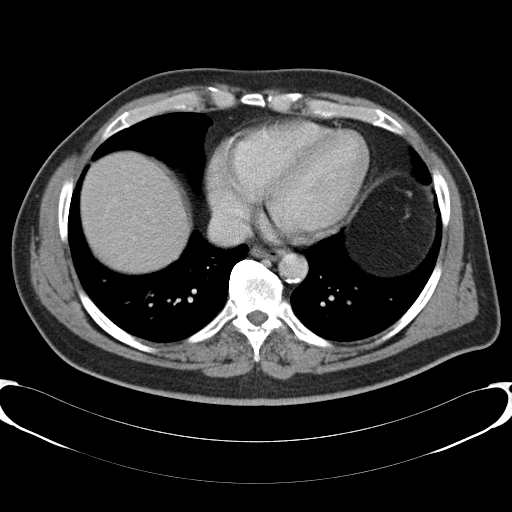
[im 53/58  soft-tissue]
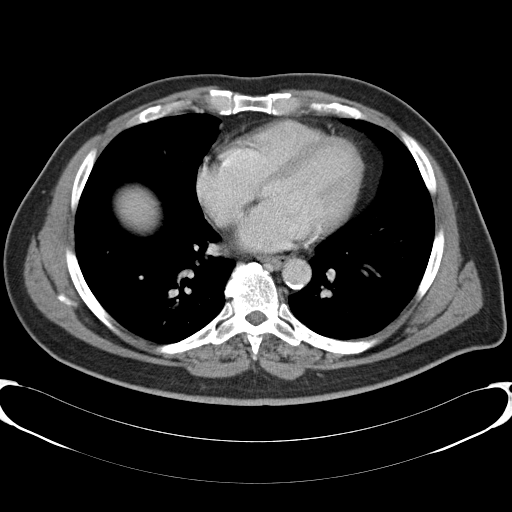

[Series 3: coronal · coronal · 0.62mm/px · 3 of 142 slices shown]
[im 48/142  soft-tissue]
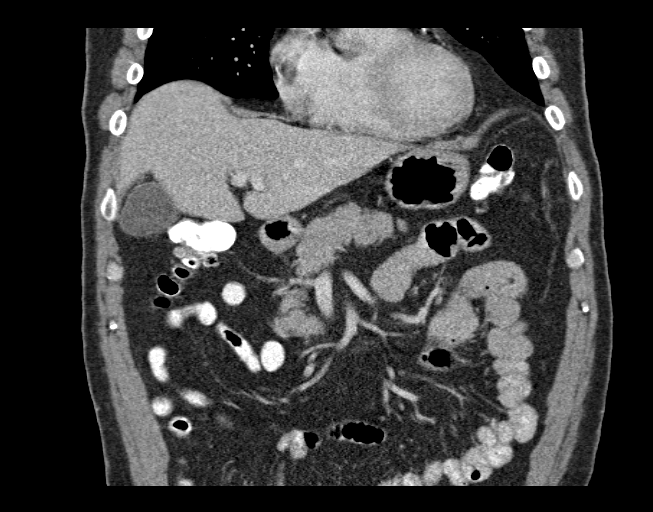
[im 63/142  soft-tissue]
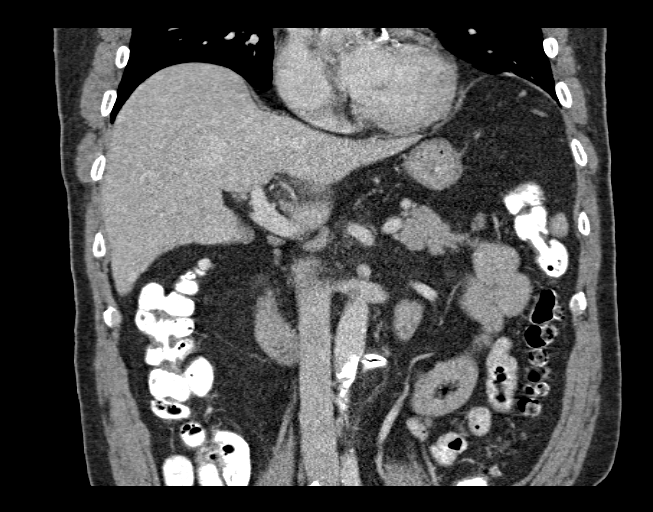
[im 79/142  soft-tissue]
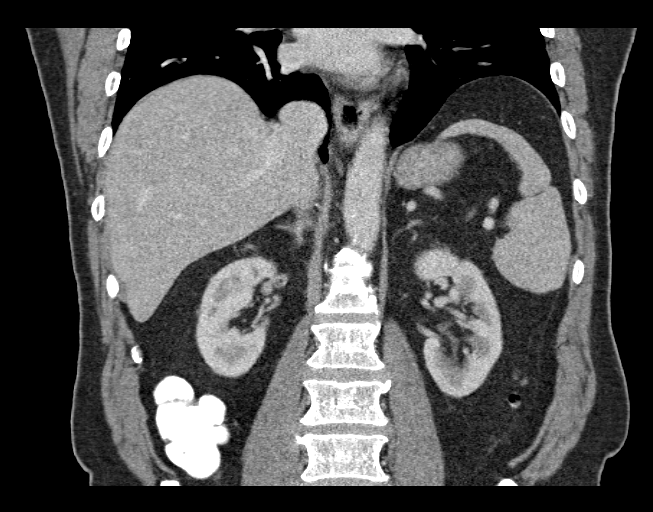

[15 of 46 positions shown; findings below may reference images not displayed]

FINDINGS: Lower chest: Clear lung bases. Normal heart size without pericardial
or pleural effusion. Multivessel coronary artery atherosclerosis.
Tiny fat containing right-sided Bochdalek's hernia.

Hepatobiliary: No evidence of hepatomegaly. No focal liver lesion.
Cholelithiasis. No acute cholecystitis or biliary duct dilatation.

Pancreas: Normal, without mass or ductal dilatation.

Spleen: Normal in size, without focal abnormality.

Adrenals/Urinary Tract: Normal adrenal glands. Possible punctate
left renal collecting system calculus. Normal right kidney, without
hydronephrosis.

Stomach/Bowel: Normal stomach, without wall thickening. Scattered
colonic diverticula. Normal abdominal small bowel.

Vascular/Lymphatic: Aortic and branch vessel atherosclerosis. No
retroperitoneal or retrocrural adenopathy.

Other: No ascites.

Musculoskeletal: Moderate thoracic spondylosis.
IMPRESSION: 1.  No acute abdominal process.
2. No hepatomegaly.
3. Possible left nephrolithiasis.
4.  Coronary artery atherosclerosis. Aortic atherosclerosis.

## 2017-05-25 ENCOUNTER — Other Ambulatory Visit: Payer: Self-pay | Admitting: Family Medicine

## 2017-06-20 ENCOUNTER — Other Ambulatory Visit: Payer: Self-pay

## 2017-06-20 MED ORDER — LISINOPRIL 20 MG PO TABS: 20.0000 mg | ORAL_TABLET | Freq: Every day | ORAL | 1 refills | Status: DC

## 2017-07-18 IMAGING — US US ABDOMEN COMPLETE
1 series · 14 of 25 positions shown · non-contrast
Comparison: 04/16/2010

CLINICAL DATA: Elevated liver enzymes

EXAM:
ABDOMEN ULTRASOUND COMPLETE

[Series 1: us abdomen complete · 0.19mm/px · 14 of 127 slices shown]
[im 1/127]
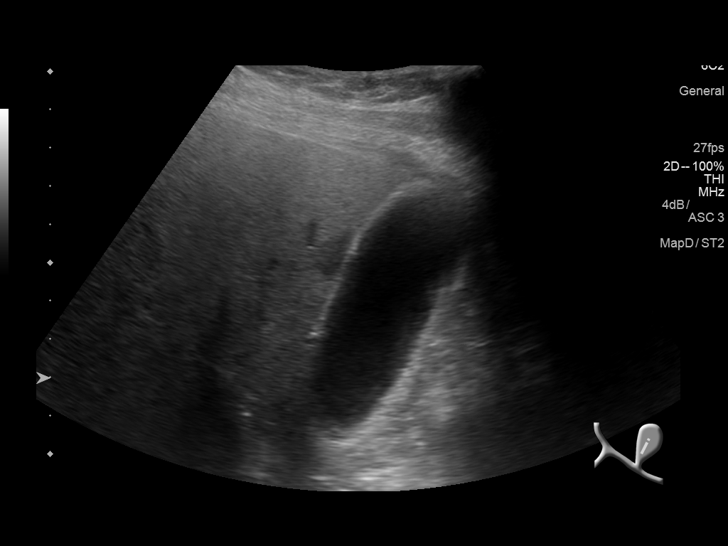
[im 11/127]
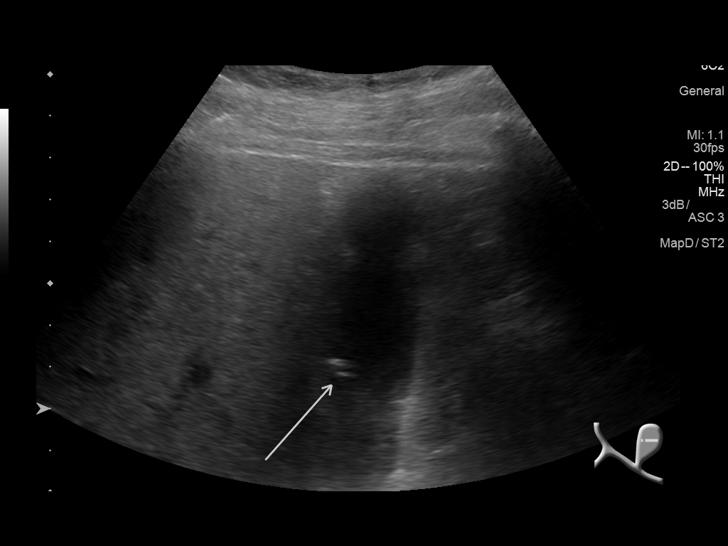
[im 22/127]
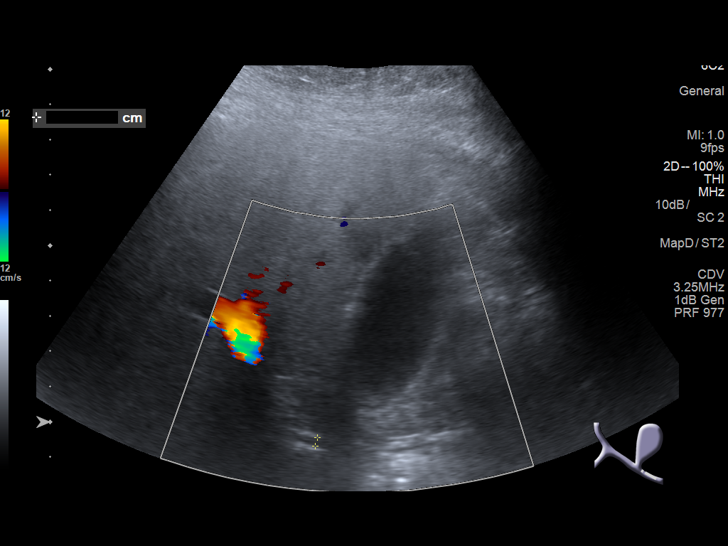
[im 32/127]
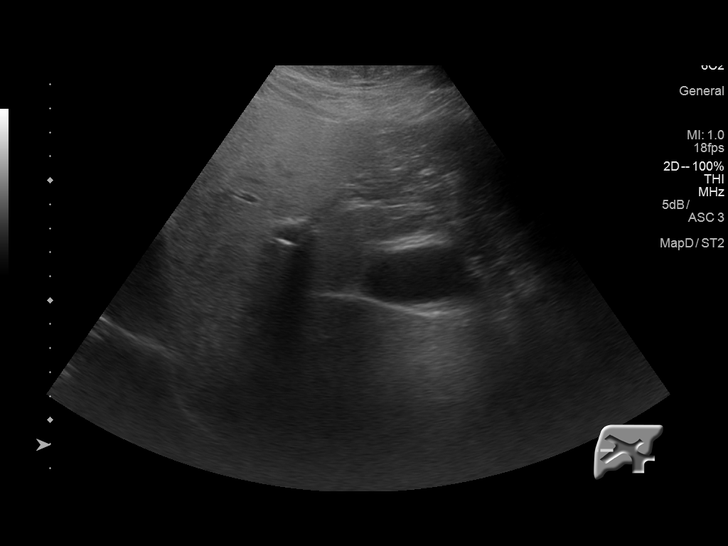
[im 43/127]
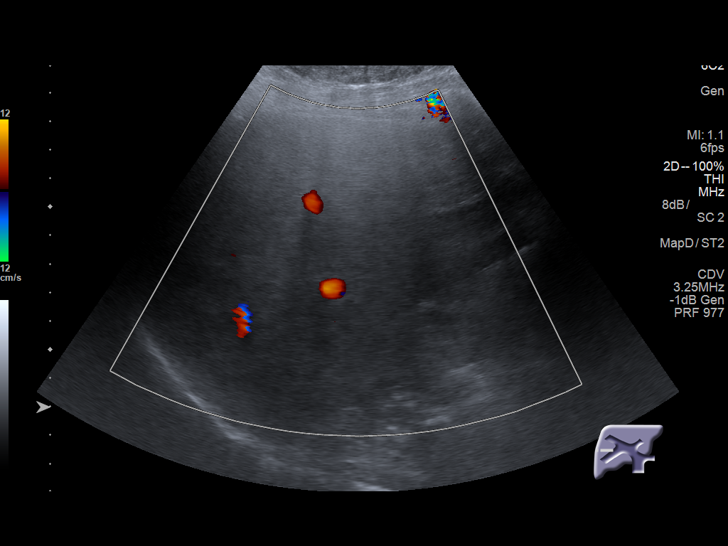
[im 48/127]
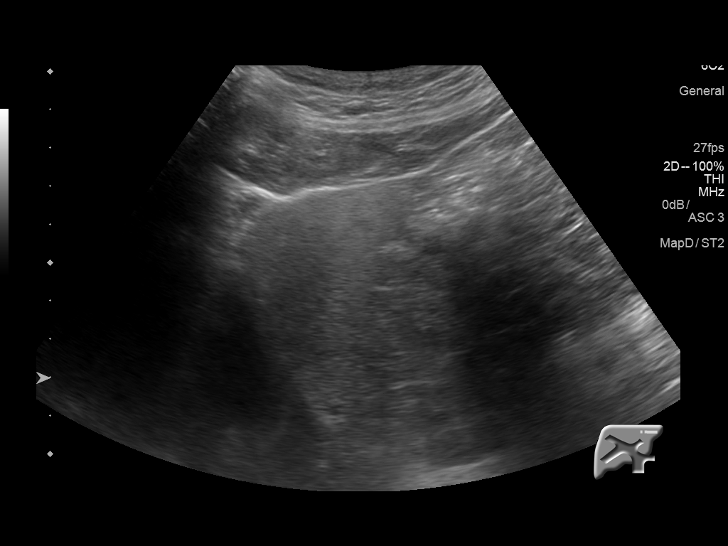
[im 58/127]
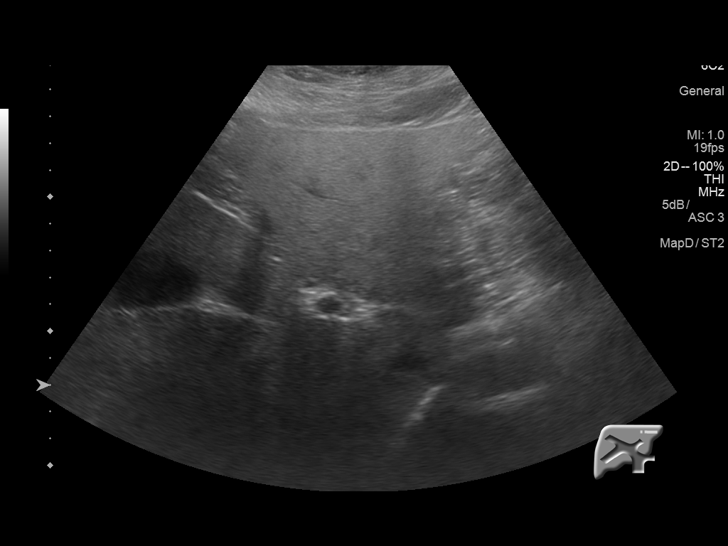
[im 69/127]
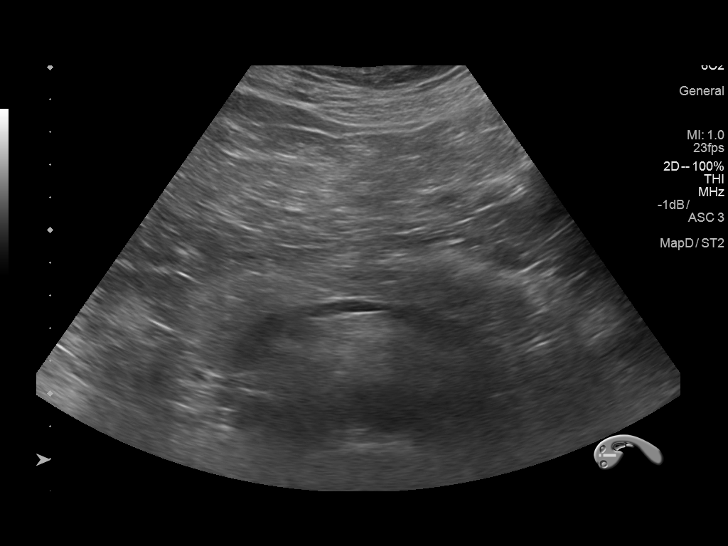
[im 79/127]
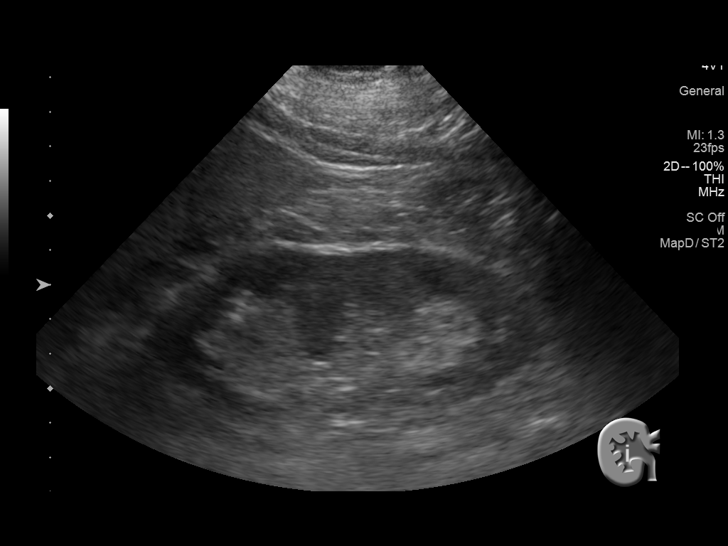
[im 85/127]
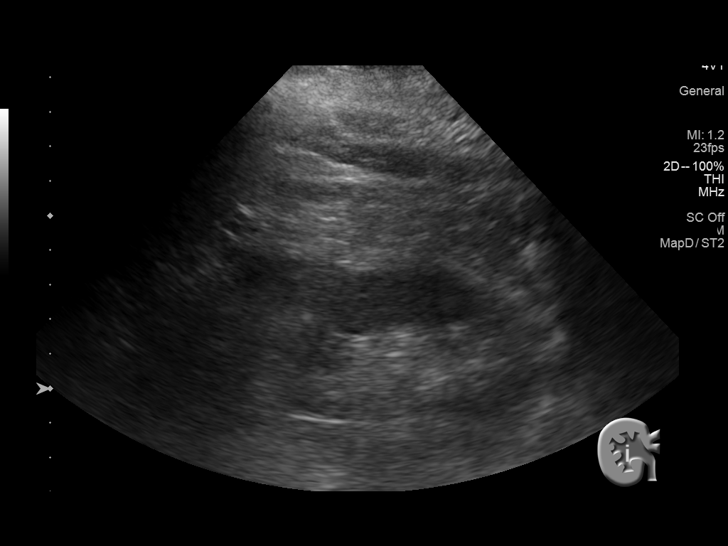
[im 95/127]
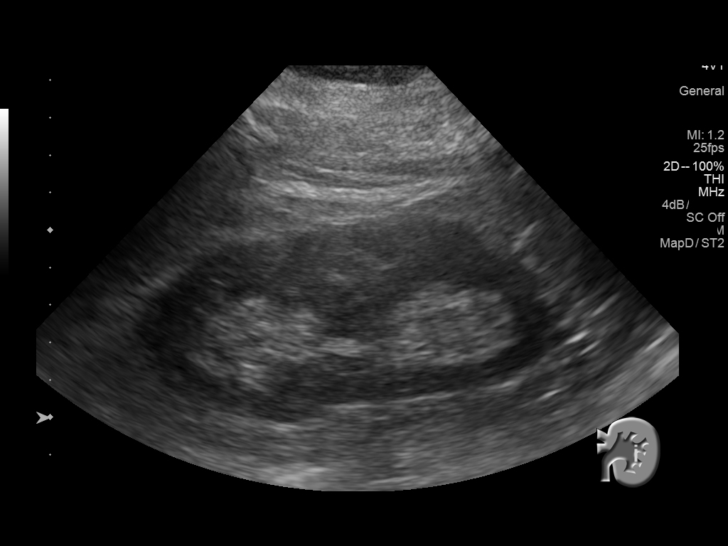
[im 106/127]
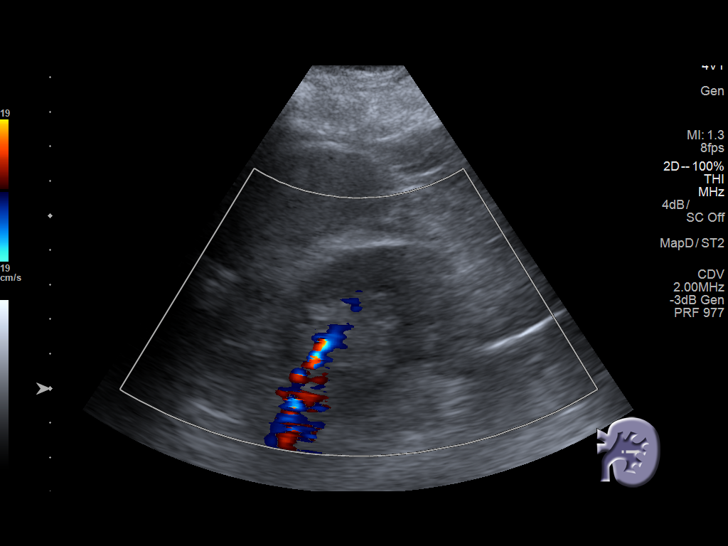
[im 116/127]
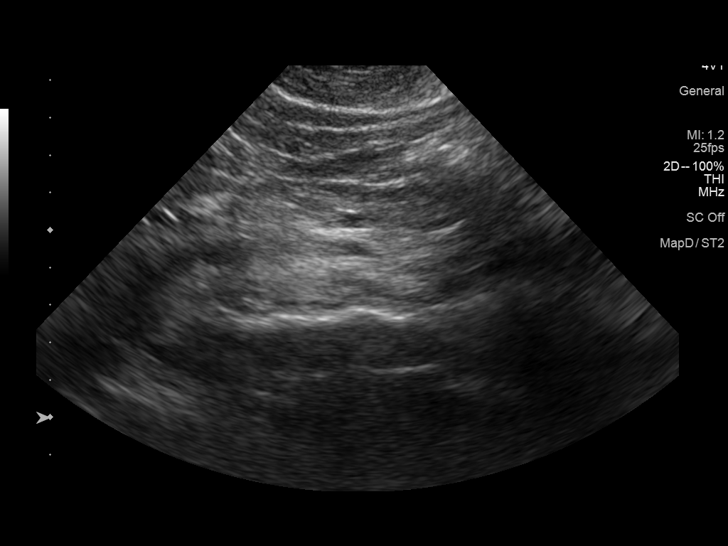
[im 127/127]
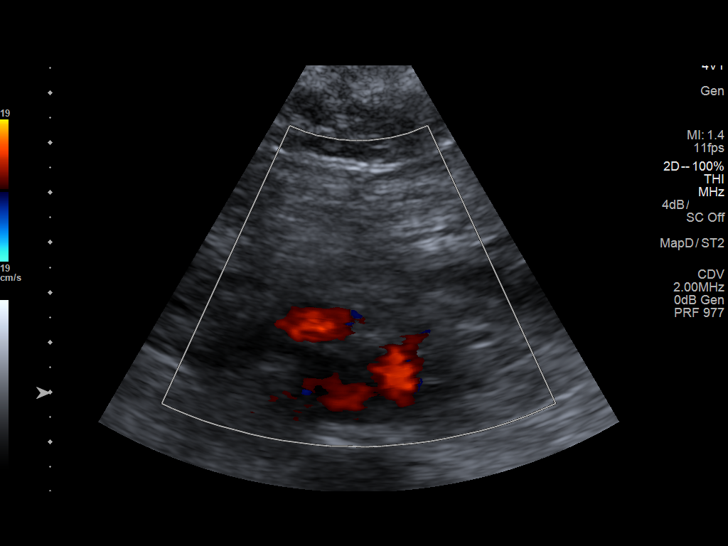

[14 of 25 positions shown; findings below may reference images not displayed]

FINDINGS: Gallbladder: Small gallstones are noted without evidence of
gallbladder wall thickening.

Common bile duct: Diameter: 2.5 mm.

Liver: Mildly increased in echogenicity consistent with fatty
infiltration. An area of decreased attenuation is noted adjacent to
the gallbladder fossa consistent with focal fatty sparing.

IVC: No abnormality visualized.

Pancreas: Visualized portion unremarkable.

Spleen: Size and appearance within normal limits.

Right Kidney: Length: 11.4 cm.. Echogenicity within normal limits.
No mass or hydronephrosis visualized.

Left Kidney: Length: 11.2 cm.. Echogenicity within normal limits. No
mass or hydronephrosis visualized.

Abdominal aorta: No aneurysm is seen.  Calcific changes are noted.

Other findings: None.
IMPRESSION: Cholelithiasis without complicating factors.

Fatty infiltration of the liver.

## 2017-08-21 ENCOUNTER — Ambulatory Visit (INDEPENDENT_AMBULATORY_CARE_PROVIDER_SITE_OTHER): Payer: Medicare Other | Admitting: Family Medicine

## 2017-08-21 ENCOUNTER — Encounter: Payer: Self-pay | Admitting: Family Medicine

## 2017-08-21 VITALS — BP 138/76 | Temp 98.6°F | Ht 66.0 in | Wt 202.0 lb

## 2017-08-21 DIAGNOSIS — K5792 Diverticulitis of intestine, part unspecified, without perforation or abscess without bleeding: Secondary | ICD-10-CM | POA: Diagnosis not present

## 2017-08-21 MED ORDER — METRONIDAZOLE 500 MG PO TABS: ORAL_TABLET | ORAL | 0 refills | Status: DC

## 2017-08-21 MED ORDER — CIPROFLOXACIN HCL 750 MG PO TABS: ORAL_TABLET | ORAL | 0 refills | Status: DC

## 2017-08-21 NOTE — Progress Notes (Signed)
   Subjective:    Patient ID: Joseph Rodgers, male    DOB: 05/01/1950, 67 y.o.   MRN: [id number]  HPI  Patient is here today with complaints of a diverticulitis flare.He states he woke Saturday am with adb pain.He has been taking tylenol for it.  Friday started getting sense of discomfort  Sat morn woke up fairly painful  Took occa aleabve, then went to tylenol  Used an ice pk  Low grade temp 99.5 last took tyle last night  No nausea   Review of Systems No headache, no major weight loss or weight gain, no chest pain no back pain no change in bowel habits complete ROS otherwise negative     Objective:   Physical Exam  Alert vitals stable, NAD. Blood pressure good on repeat. HEENT normal. Lungs clear. Heart regular rate and rhythm. Distinct left lower quadrant tenderness to palpation no rebound no guarding good bowel sounds      Assessment & Plan:  Impression probable flare of diverticulitis.  Warning signs discussed.  Cipro 750 get a 10 days metronidazole 500 3 times daily 10 days utilize probiotics rationale discussed  Hypertension improved on repeat patient brings in blood pressure numbers which are in good range

## 2017-09-14 ENCOUNTER — Other Ambulatory Visit: Payer: Self-pay

## 2017-09-28 ENCOUNTER — Telehealth: Payer: Self-pay | Admitting: Family Medicine

## 2017-09-28 DIAGNOSIS — R160 Hepatomegaly, not elsewhere classified: Secondary | ICD-10-CM

## 2017-09-28 DIAGNOSIS — M1000 Idiopathic gout, unspecified site: Secondary | ICD-10-CM

## 2017-09-28 DIAGNOSIS — Z125 Encounter for screening for malignant neoplasm of prostate: Secondary | ICD-10-CM

## 2017-09-28 DIAGNOSIS — E785 Hyperlipidemia, unspecified: Secondary | ICD-10-CM

## 2017-09-28 DIAGNOSIS — I10 Essential (primary) hypertension: Secondary | ICD-10-CM

## 2017-09-28 NOTE — Telephone Encounter (Signed)
Pt needs labs ordered His PE is 10/24/17 - please call pt when orders are ready

## 2017-09-28 NOTE — Telephone Encounter (Signed)
Lipid, liver, metabolic 7, uric acid, PSA

## 2017-09-28 NOTE — Telephone Encounter (Signed)
Last had labs drawn 09/30/2016 Lip,liver,psa,uric acid,bmet.

## 2017-09-29 NOTE — Telephone Encounter (Signed)
Orders placed and the pt is aware. 

## 2017-10-17 DIAGNOSIS — E785 Hyperlipidemia, unspecified: Secondary | ICD-10-CM | POA: Diagnosis not present

## 2017-10-17 DIAGNOSIS — R160 Hepatomegaly, not elsewhere classified: Secondary | ICD-10-CM | POA: Diagnosis not present

## 2017-10-17 DIAGNOSIS — M1000 Idiopathic gout, unspecified site: Secondary | ICD-10-CM | POA: Diagnosis not present

## 2017-10-17 DIAGNOSIS — Z125 Encounter for screening for malignant neoplasm of prostate: Secondary | ICD-10-CM | POA: Diagnosis not present

## 2017-10-17 DIAGNOSIS — I10 Essential (primary) hypertension: Secondary | ICD-10-CM | POA: Diagnosis not present

## 2017-10-18 LAB — HEPATIC FUNCTION PANEL
ALK PHOS: 65 IU/L (ref 39–117)
ALT: 23 IU/L (ref 0–44)
AST: 25 IU/L (ref 0–40)
Albumin: 4.6 g/dL (ref 3.6–4.8)
Bilirubin Total: 0.5 mg/dL (ref 0.0–1.2)
Bilirubin, Direct: 0.12 mg/dL (ref 0.00–0.40)
TOTAL PROTEIN: 7 g/dL (ref 6.0–8.5)

## 2017-10-18 LAB — BASIC METABOLIC PANEL
BUN/Creatinine Ratio: 15 (ref 10–24)
BUN: 15 mg/dL (ref 8–27)
CHLORIDE: 103 mmol/L (ref 96–106)
CO2: 23 mmol/L (ref 20–29)
CREATININE: 1.02 mg/dL (ref 0.76–1.27)
Calcium: 9.6 mg/dL (ref 8.6–10.2)
GFR calc Af Amer: 88 mL/min/{1.73_m2} (ref >59)
GFR calc non Af Amer: 76 mL/min/{1.73_m2} (ref >59)
GLUCOSE: 98 mg/dL (ref 65–99)
POTASSIUM: 4.8 mmol/L (ref 3.5–5.2)
SODIUM: 142 mmol/L (ref 134–144)

## 2017-10-18 LAB — LIPID PANEL
Chol/HDL Ratio: 6.1 ratio — ABNORMAL HIGH (ref 0.0–5.0)
Cholesterol, Total: 244 mg/dL — ABNORMAL HIGH (ref 100–199)
HDL: 40 mg/dL (ref >39)
LDL Calculated: 154 mg/dL — ABNORMAL HIGH (ref 0–99)
Triglycerides: 248 mg/dL — ABNORMAL HIGH (ref 0–149)
VLDL CHOLESTEROL CAL: 50 mg/dL — AB (ref 5–40)

## 2017-10-18 LAB — PSA: PROSTATE SPECIFIC AG, SERUM: 1.6 ng/mL (ref 0.0–4.0)

## 2017-10-18 LAB — URIC ACID: URIC ACID: 7.6 mg/dL (ref 3.7–8.6)

## 2017-10-24 ENCOUNTER — Ambulatory Visit (INDEPENDENT_AMBULATORY_CARE_PROVIDER_SITE_OTHER): Payer: Medicare Other | Admitting: Family Medicine

## 2017-10-24 ENCOUNTER — Encounter: Payer: Self-pay | Admitting: Family Medicine

## 2017-10-24 VITALS — BP 150/72 | Ht 65.75 in | Wt 200.0 lb

## 2017-10-24 DIAGNOSIS — Z1211 Encounter for screening for malignant neoplasm of colon: Secondary | ICD-10-CM

## 2017-10-24 DIAGNOSIS — E7849 Other hyperlipidemia: Secondary | ICD-10-CM

## 2017-10-24 DIAGNOSIS — Z0000 Encounter for general adult medical examination without abnormal findings: Secondary | ICD-10-CM | POA: Diagnosis not present

## 2017-10-24 DIAGNOSIS — I10 Essential (primary) hypertension: Secondary | ICD-10-CM | POA: Diagnosis not present

## 2017-10-24 MED ORDER — LISINOPRIL 20 MG PO TABS: 20.0000 mg | ORAL_TABLET | Freq: Every day | ORAL | 1 refills | Status: DC

## 2017-10-24 MED ORDER — AMLODIPINE BESYLATE 2.5 MG PO TABS: 2.5000 mg | ORAL_TABLET | Freq: Every day | ORAL | 1 refills | Status: DC

## 2017-10-24 NOTE — Patient Instructions (Signed)
Results for orders placed or performed in visit on 09/28/17  Lipid panel  Result Value Ref Range   Cholesterol, Total 244 (H) 100 - 199 mg/dL   Triglycerides 248 (H) 0 - 149 mg/dL   HDL 40 >39 mg/dL   VLDL Cholesterol Cal 50 (H) 5 - 40 mg/dL   LDL Calculated 154 (H) 0 - 99 mg/dL   Chol/HDL Ratio 6.1 (H) 0.0 - 5.0 ratio  Hepatic function panel  Result Value Ref Range   Total Protein 7.0 6.0 - 8.5 g/dL   Albumin 4.6 3.6 - 4.8 g/dL   Bilirubin Total 0.5 0.0 - 1.2 mg/dL   Bilirubin, Direct 0.12 0.00 - 0.40 mg/dL   Alkaline Phosphatase 65 39 - 117 IU/L   AST 25 0 - 40 IU/L   ALT 23 0 - 44 IU/L  Basic metabolic panel  Result Value Ref Range   Glucose 98 65 - 99 mg/dL   BUN 15 8 - 27 mg/dL   Creatinine, Ser 1.02 0.76 - 1.27 mg/dL   GFR calc non Af Amer 76 >59 mL/min/1.73   GFR calc Af Amer 88 >59 mL/min/1.73   BUN/Creatinine Ratio 15 10 - 24   Sodium 142 134 - 144 mmol/L   Potassium 4.8 3.5 - 5.2 mmol/L   Chloride 103 96 - 106 mmol/L   CO2 23 20 - 29 mmol/L   Calcium 9.6 8.6 - 10.2 mg/dL  Uric acid  Result Value Ref Range   Uric Acid 7.6 3.7 - 8.6 mg/dL  PSA  Result Value Ref Range   Prostate Specific Ag, Serum 1.6 0.0 - 4.0 ng/mL

## 2017-10-24 NOTE — Progress Notes (Signed)
Subjective:    Patient ID: Joseph Rodgers, male    DOB: 26-Apr-1950, 67 y.o.   MRN: [id number]  HPI AWV- Annual Wellness Visit  The patient was seen for their annual wellness visit. The patient's past medical history, surgical history, and family history were reviewed. Pertinent vaccines were reviewed ( tetanus, pneumonia, shingles, flu) The patient's medication list was reviewed and updated.  The height and weight were entered.  BMI recorded in electronic record elsewhere  Cognitive screening was completed. Outcome of Mini - Cog: pass   Falls /depression screening electronically recorded within record elsewhere  Current tobacco usage: none (All patients who use tobacco were given written and verbal information on quitting)  Recent listing of emergency department/hospitalizations over the past year were reviewed.  current specialist the patient sees on a regular basis: none   Medicare annual wellness visit patient questionnaire was reviewed.  A written screening schedule for the patient for the next 5-10 years was given. Appropriate discussion of followup regarding next visit was discussed.   Would like a 90 day supply on lisinopril and amlodipine.   Thinks one of his meds is causing muscle soreness in upper arms.  Patient is here today with follow-up regarding blood pressure as well.  He does take his medicines on a regular basis.  He wonders if the amlodipine is causing his soreness in his shoulders but he is not having soreness anywhere else I think the likelihood of that being caused by amlodipine is low I think it is more likely related to activity and age because shoulder discomfort among older individuals is a common issue not causing any radiation down the arms he denies any chest tightness pressure pain  Hyperlipidemia patient does not tolerate statins.  He cannot afford the injectables.  Also the injectables would not be covered under his stipulation.  He is trying to  watch his diet as best as possible.   Review of Systems  Constitutional: Negative for activity change, appetite change and fever.  HENT: Negative for congestion and rhinorrhea.   Eyes: Negative for discharge.  Respiratory: Negative for cough and wheezing.   Cardiovascular: Negative for chest pain.  Gastrointestinal: Negative for abdominal pain, blood in stool and vomiting.  Genitourinary: Negative for difficulty urinating and frequency.  Musculoskeletal: Negative for neck pain.  Skin: Negative for rash.  Allergic/Immunologic: Negative for environmental allergies and food allergies.  Neurological: Negative for weakness and headaches.  Psychiatric/Behavioral: Negative for agitation.       Objective:   Physical Exam  Constitutional: He appears well-developed and well-nourished.  HENT:  Head: Normocephalic and atraumatic.  Right Ear: External ear normal.  Left Ear: External ear normal.  Nose: Nose normal.  Mouth/Throat: Oropharynx is clear and moist.  Eyes: Pupils are equal, round, and reactive to light. EOM are normal.  Neck: Normal range of motion. Neck supple. No thyromegaly present.  Cardiovascular: Normal rate, regular rhythm and normal heart sounds.  No murmur heard. Pulmonary/Chest: Effort normal and breath sounds normal. No respiratory distress. He has no wheezes.  Abdominal: Soft. Bowel sounds are normal. He exhibits no distension and no mass. There is no tenderness.  Genitourinary: Penis normal.  Musculoskeletal: Normal range of motion. He exhibits no edema.  Lymphadenopathy:    He has no cervical adenopathy.  Neurological: He is alert. He exhibits normal muscle tone.  Skin: Skin is warm and dry. No erythema.  Psychiatric: He has a normal mood and affect. His behavior is normal. Judgment normal.  Prostate exam is normal.      Assessment & Plan:  Adult wellness-complete.wellness physical was conducted today. Importance of diet and exercise were discussed in  detail.  In addition to this a discussion regarding safety was also covered. We also reviewed over immunizations and gave recommendations regarding current immunization needed for age.  In addition to this additional areas were also touched on including: Preventative health exams needed:  Colonoscopy patient is due for colonoscopy be but he defers we will do the stool test for blood  Patient was advised yearly wellness exam  Blood pressure decent control watch diet closely follow-up 6 months refills were sent in minimize salt in the diet. HTN- Patient was seen today as part of a visit regarding hypertension. The importance of healthy diet and regular physical activity was discussed. The importance of compliance with medications discussed.  Ideal goal is to keep blood pressure low elevated levels certainly below 010/93 when possible.  The patient was counseled that keeping blood pressure under control lessen his risk of complications.  The importance of regular follow-ups was discussed with the patient.  Low-salt diet such as DASH recommended.  Regular physical activity was recommended as well.  Patient was advised to keep regular follow-ups.  Hyperlipidemia unfortunately cannot tolerate medications and cannot afford injectables he will do the best he can at watching diet we will follow-up lab work in spring time.

## 2017-10-25 ENCOUNTER — Telehealth: Payer: Self-pay | Admitting: Family Medicine

## 2017-10-25 DIAGNOSIS — Z1211 Encounter for screening for malignant neoplasm of colon: Secondary | ICD-10-CM

## 2017-10-25 NOTE — Telephone Encounter (Signed)
Screening-thank you

## 2017-10-25 NOTE — Telephone Encounter (Signed)
Patient was just seen yesterday and has decided that he wants to have his colonoscopy done and would like a referral placed.

## 2017-10-25 NOTE — Telephone Encounter (Signed)
Referral put in. Pt notified.  

## 2017-10-25 NOTE — Telephone Encounter (Signed)
Seen yesterday. Ok to put in referral for screening colonoscopy or was the pt having any issues to where he needs to be sooner.

## 2017-10-27 ENCOUNTER — Other Ambulatory Visit: Payer: Self-pay

## 2017-10-27 ENCOUNTER — Telehealth: Payer: Self-pay | Admitting: *Deleted

## 2017-10-27 DIAGNOSIS — Z0000 Encounter for general adult medical examination without abnormal findings: Secondary | ICD-10-CM

## 2017-10-27 DIAGNOSIS — Z1211 Encounter for screening for malignant neoplasm of colon: Secondary | ICD-10-CM

## 2017-10-27 DIAGNOSIS — K921 Melena: Secondary | ICD-10-CM

## 2017-10-27 LAB — IFOBT (OCCULT BLOOD): IFOBT: POSITIVE

## 2017-10-27 NOTE — Telephone Encounter (Signed)
Please see result note, highly recommend gastroenterology referral go ahead and initiate and inform patient It is best for this to be checked out thoroughly. Hopefully everything will resolve fine but more than likely they will need to do a colonoscopy

## 2017-10-27 NOTE — Telephone Encounter (Signed)
hemossure test was positive. Please see result note

## 2017-10-27 NOTE — Telephone Encounter (Signed)
Referral placed and pt is aware to await a call from gastroenterology.

## 2017-10-30 ENCOUNTER — Encounter: Payer: Self-pay | Admitting: Family Medicine

## 2017-11-07 ENCOUNTER — Encounter: Payer: Self-pay | Admitting: Internal Medicine

## 2017-11-09 DIAGNOSIS — H1132 Conjunctival hemorrhage, left eye: Secondary | ICD-10-CM | POA: Diagnosis not present

## 2017-11-13 ENCOUNTER — Ambulatory Visit (INDEPENDENT_AMBULATORY_CARE_PROVIDER_SITE_OTHER): Payer: Medicare Other | Admitting: Gastroenterology

## 2017-11-13 ENCOUNTER — Encounter: Payer: Self-pay | Admitting: Gastroenterology

## 2017-11-13 ENCOUNTER — Other Ambulatory Visit: Payer: Self-pay

## 2017-11-13 DIAGNOSIS — R195 Other fecal abnormalities: Secondary | ICD-10-CM

## 2017-11-13 MED ORDER — NA SULFATE-K SULFATE-MG SULF 17.5-3.13-1.6 GM/177ML PO SOLN: 1.0000 | ORAL | 0 refills | Status: DC

## 2017-11-13 NOTE — Patient Instructions (Signed)
We have scheduled you for a colonoscopy with Dr. Rourk in the near future!  Further recommendations to follow.  It was a pleasure to see you today. I strive to create trusting relationships with patients to provide genuine, compassionate, and quality care. I value your feedback. If you receive a survey regarding your visit,  I greatly appreciate you taking time to fill this out.   Cylee Dattilo W. Xayne Brumbaugh, PhD, ANP-BC [facility]    

## 2017-11-13 NOTE — H&P (View-Only) (Signed)
Primary Care Physician:  Kathyrn Drown, MD Primary Gastroenterologist:  Dr. Gala Romney   Chief Complaint  Patient presents with  . Blood In Stools    last tcs approx 12 years ago    HPI:   Joseph Rodgers is a 67 y.o. male presenting today at the request of Dr. Wolfgang Phoenix secondary to heme positive stool. His last colonoscopy was in 2005 by Dr. Gala Romney with  normal rectum, left-sided transverse diverticulum, normal TI.    Rare scant spot of blood on tissue. History of hemorrhoids in the past. Some nervous stomach  at times. Denies chronic GERD. No dysphagia. No unexplained weight loss or loss of appetite.    Past Medical History:  Diagnosis Date  . Allergic rhinitis   . Coronary atherosclerosis of native coronary artery   . Diverticulitis    occasional  . Hyperlipidemia   . Hypertension    On meds, runs higher at MD office  . Kidney stones     Past Surgical History:  Procedure Laterality Date  . APPENDECTOMY    . colonoscopy  2005   Dr. Gala Romney: normal rectum, left-sided transverse diverticulum, normal TI  . DOBUTAMINE STRESS ECHO  3/12   normal    Current Outpatient Medications  Medication Sig Dispense Refill  . acetaminophen (TYLENOL) 500 MG tablet Take 500 mg by mouth every 6 (six) hours as needed.      Marland Kitchen amLODipine (NORVASC) 2.5 MG tablet Take 1 tablet (2.5 mg total) by mouth daily. 90 tablet 1  . lisinopril (PRINIVIL,ZESTRIL) 20 MG tablet Take 1 tablet (20 mg total) by mouth daily. 90 tablet 1  . naproxen sodium (ALEVE) 220 MG tablet Take 220 mg by mouth as needed.    . Probiotic Product (PROBIOTIC PO) daily.     . Na Sulfate-K Sulfate-Mg Sulf (SUPREP BOWEL PREP KIT) 17.5-3.13-1.6 GM/177ML SOLN Take 1 kit by mouth as directed. 1 Bottle 0   No current facility-administered medications for this visit.     Allergies as of 11/13/2017 - Review Complete 11/13/2017  Allergen Reaction Noted  . Red yeast rice [cholestin]  03/14/2017  . Sulfa drugs cross reactors   05/07/2010  . Simvastatin [fd&c red #40 [place]-simvastatin]  05/07/2010    Family History  Problem Relation Age of Onset  . Diabetes Mother   . Aneurysm Mother 62       [name]  . Colon cancer Neg Hx   . Colon polyps Neg Hx     Social History   Socioeconomic History  . Marital status: Divorced    Spouse name: Not on file  . Number of children: 0  . Years of education: Not on file  . Highest education level: Not on file  Occupational History  . Occupation: Furniture conservator/restorer  Social Needs  . Financial resource strain: Not on file  . Food insecurity:    Worry: Not on file    Inability: Not on file  . Transportation needs:    Medical: Not on file    Non-medical: Not on file  Tobacco Use  . Smoking status: Never Smoker  . Smokeless tobacco: Never Used  Substance and Sexual Activity  . Alcohol use: Not Currently    Comment: Occasional Beer, rare   . Drug use: No  . Sexual activity: Not on file  Lifestyle  . Physical activity:    Days per week: Not on file    Minutes per session: Not on file  . Stress: Not on  file  Relationships  . Social connections:    Talks on phone: Not on file    Gets together: Not on file    Attends religious service: Not on file    Active member of club or organization: Not on file    Attends meetings of clubs or organizations: Not on file    Relationship status: Not on file  . Intimate partner violence:    Fear of current or ex partner: Not on file    Emotionally abused: Not on file    Physically abused: Not on file    Forced sexual activity: Not on file  Other Topics Concern  . Not on file  Social History Narrative  . Not on file    Review of Systems: Gen: Denies any fever, chills, fatigue, weight loss, lack of appetite.  CV: Denies chest pain, heart palpitations, peripheral edema, syncope.  Resp: Denies shortness of breath at rest or with exertion. Denies wheezing or cough.  GI: see HPI  GU : Denies urinary burning, urinary frequency,  urinary hesitancy MS: Denies joint pain, muscle weakness, cramps, or limitation of movement.  Derm: Denies rash, itching, dry skin Psych: Denies depression, anxiety, memory loss, and confusion Heme: Denies bruising, bleeding, and enlarged lymph nodes.  Physical Exam: BP (!) 170/90   Pulse (!) 106   Temp 98.5 F (36.9 C) (Oral)   Ht _0  (1.702 m)   Wt 204 lb 6.4 oz (92.7 kg)   BMI 32.01 kg/m  General:   Alert and oriented. Pleasant and cooperative. Well-nourished and well-developed.  Head:  Normocephalic and atraumatic. Eyes:  Without icterus, sclera clear and conjunctiva pink.  Ears:  Normal auditory acuity. Nose:  No deformity, discharge,  or lesions. Mouth:  No deformity or lesions, oral mucosa pink.  Lungs:  Clear to auscultation bilaterally. No wheezes, rales, or rhonchi. No distress.  Heart:  S1, S2 present without murmurs appreciated.  Abdomen:  +BS, soft, non-tender and non-distended. No HSM noted. No guarding or rebound. No masses appreciated.  Rectal:  Deferred  Msk:  Symmetrical without gross deformities. Normal posture. Extremities:  Without  edema. Neurologic:  Alert and  oriented x4 Psych:  Alert and cooperative. Normal mood and affect.

## 2017-11-13 NOTE — Progress Notes (Signed)
    Primary Care Physician:  Luking, Scott A, MD Primary Gastroenterologist:  Dr. Rourk   Chief Complaint  Patient presents with  . Blood In Stools    last tcs approx 12 years ago    HPI:   Joseph Rodgers is a 67 y.o. male presenting today at the request of Dr. Luking secondary to heme positive stool. His last colonoscopy was in 2005 by Dr. Rourk with  normal rectum, left-sided transverse diverticulum, normal TI.    Rare scant spot of blood on tissue. History of hemorrhoids in the past. Some nervous stomach  at times. Denies chronic GERD. No dysphagia. No unexplained weight loss or loss of appetite.    Past Medical History:  Diagnosis Date  . Allergic rhinitis   . Coronary atherosclerosis of native coronary artery   . Diverticulitis    occasional  . Hyperlipidemia   . Hypertension    On meds, runs higher at MD office  . Kidney stones     Past Surgical History:  Procedure Laterality Date  . APPENDECTOMY    . colonoscopy  2005   Dr. Rourk: normal rectum, left-sided transverse diverticulum, normal TI  . DOBUTAMINE STRESS ECHO  3/12   normal    Current Outpatient Medications  Medication Sig Dispense Refill  . acetaminophen (TYLENOL) 500 MG tablet Take 500 mg by mouth every 6 (six) hours as needed.      . amLODipine (NORVASC) 2.5 MG tablet Take 1 tablet (2.5 mg total) by mouth daily. 90 tablet 1  . lisinopril (PRINIVIL,ZESTRIL) 20 MG tablet Take 1 tablet (20 mg total) by mouth daily. 90 tablet 1  . naproxen sodium (ALEVE) 220 MG tablet Take 220 mg by mouth as needed.    . Probiotic Product (PROBIOTIC PO) daily.     . Na Sulfate-K Sulfate-Mg Sulf (SUPREP BOWEL PREP KIT) 17.5-3.13-1.6 GM/177ML SOLN Take 1 kit by mouth as directed. 1 Bottle 0   No current facility-administered medications for this visit.     Allergies as of 11/13/2017 - Review Complete 11/13/2017  Allergen Reaction Noted  . Red yeast rice [cholestin]  03/14/2017  . Sulfa drugs cross reactors   05/07/2010  . Simvastatin [fd&c red #40 [place]-simvastatin]  05/07/2010    Family History  Problem Relation Age of Onset  . Diabetes Mother   . Aneurysm Mother 68       [name]  . Colon cancer Neg Hx   . Colon polyps Neg Hx     Social History   Socioeconomic History  . Marital status: Divorced    Spouse name: Not on file  . Number of children: 0  . Years of education: Not on file  . Highest education level: Not on file  Occupational History  . Occupation: Machinist  Social Needs  . Financial resource strain: Not on file  . Food insecurity:    Worry: Not on file    Inability: Not on file  . Transportation needs:    Medical: Not on file    Non-medical: Not on file  Tobacco Use  . Smoking status: Never Smoker  . Smokeless tobacco: Never Used  Substance and Sexual Activity  . Alcohol use: Not Currently    Comment: Occasional Beer, rare   . Drug use: No  . Sexual activity: Not on file  Lifestyle  . Physical activity:    Days per week: Not on file    Minutes per session: Not on file  . Stress: Not on   file  Relationships  . Social connections:    Talks on phone: Not on file    Gets together: Not on file    Attends religious service: Not on file    Active member of club or organization: Not on file    Attends meetings of clubs or organizations: Not on file    Relationship status: Not on file  . Intimate partner violence:    Fear of current or ex partner: Not on file    Emotionally abused: Not on file    Physically abused: Not on file    Forced sexual activity: Not on file  Other Topics Concern  . Not on file  Social History Narrative  . Not on file    Review of Systems: Gen: Denies any fever, chills, fatigue, weight loss, lack of appetite.  CV: Denies chest pain, heart palpitations, peripheral edema, syncope.  Resp: Denies shortness of breath at rest or with exertion. Denies wheezing or cough.  GI: see HPI  GU : Denies urinary burning, urinary frequency,  urinary hesitancy MS: Denies joint pain, muscle weakness, cramps, or limitation of movement.  Derm: Denies rash, itching, dry skin Psych: Denies depression, anxiety, memory loss, and confusion Heme: Denies bruising, bleeding, and enlarged lymph nodes.  Physical Exam: BP (!) 170/90   Pulse (!) 106   Temp 98.5 F (36.9 C) (Oral)   Ht 5' 7" (1.702 m)   Wt 204 lb 6.4 oz (92.7 kg)   BMI 32.01 kg/m  General:   Alert and oriented. Pleasant and cooperative. Well-nourished and well-developed.  Head:  Normocephalic and atraumatic. Eyes:  Without icterus, sclera clear and conjunctiva pink.  Ears:  Normal auditory acuity. Nose:  No deformity, discharge,  or lesions. Mouth:  No deformity or lesions, oral mucosa pink.  Lungs:  Clear to auscultation bilaterally. No wheezes, rales, or rhonchi. No distress.  Heart:  S1, S2 present without murmurs appreciated.  Abdomen:  +BS, soft, non-tender and non-distended. No HSM noted. No guarding or rebound. No masses appreciated.  Rectal:  Deferred  Msk:  Symmetrical without gross deformities. Normal posture. Extremities:  Without  edema. Neurologic:  Alert and  oriented x4 Psych:  Alert and cooperative. Normal mood and affect.   

## 2017-11-13 NOTE — Assessment & Plan Note (Signed)
67 year old male with heme positive stool, noting just scant hematochezia with wiping in past. No other concerning signs/symptoms, no upper GI symptoms. Last colonoscopy in 2005 with diverticula. No family history of colon polyps or colorectal cancer.   Proceed with TCS with Dr. Gala Romney in near future: the risks, benefits, and alternatives have been discussed with the patient in detail. The patient states understanding and desires to proceed.

## 2017-11-14 NOTE — Progress Notes (Signed)
CC'D TO PCP °

## 2017-11-16 DIAGNOSIS — Z23 Encounter for immunization: Secondary | ICD-10-CM | POA: Diagnosis not present

## 2017-12-12 ENCOUNTER — Telehealth: Payer: Self-pay

## 2017-12-12 NOTE — Telephone Encounter (Signed)
Called pt, TCS for tomorrow moved up to 12:15pm. He will arrive at 11:15am. Advised him to start drinking 2nd half of prep tomorrow morning at 7:15am. NPO after 9:15am. Endo scheduler aware.

## 2017-12-13 ENCOUNTER — Other Ambulatory Visit: Payer: Self-pay

## 2017-12-13 ENCOUNTER — Ambulatory Visit (HOSPITAL_COMMUNITY)
Admission: RE | Admit: 2017-12-13 | Discharge: 2017-12-13 | Disposition: A | Discharge status: Home / Self Care | Payer: Medicare Other | Source: Ambulatory Visit | Attending: Internal Medicine | Admitting: Internal Medicine

## 2017-12-13 ENCOUNTER — Encounter (HOSPITAL_COMMUNITY): Payer: Self-pay | Admitting: *Deleted

## 2017-12-13 ENCOUNTER — Encounter (HOSPITAL_COMMUNITY)
Admission: RE | Disposition: A | Discharge status: Home / Self Care | Payer: Self-pay | Source: Ambulatory Visit | Attending: Internal Medicine

## 2017-12-13 DIAGNOSIS — I2510 Atherosclerotic heart disease of native coronary artery without angina pectoris: Secondary | ICD-10-CM | POA: Diagnosis not present

## 2017-12-13 DIAGNOSIS — D124 Benign neoplasm of descending colon: Secondary | ICD-10-CM | POA: Diagnosis not present

## 2017-12-13 DIAGNOSIS — Z79899 Other long term (current) drug therapy: Secondary | ICD-10-CM | POA: Diagnosis not present

## 2017-12-13 DIAGNOSIS — I10 Essential (primary) hypertension: Secondary | ICD-10-CM | POA: Diagnosis not present

## 2017-12-13 DIAGNOSIS — E785 Hyperlipidemia, unspecified: Secondary | ICD-10-CM | POA: Insufficient documentation

## 2017-12-13 DIAGNOSIS — K921 Melena: Secondary | ICD-10-CM | POA: Insufficient documentation

## 2017-12-13 DIAGNOSIS — D123 Benign neoplasm of transverse colon: Secondary | ICD-10-CM | POA: Diagnosis not present

## 2017-12-13 DIAGNOSIS — K5730 Diverticulosis of large intestine without perforation or abscess without bleeding: Secondary | ICD-10-CM | POA: Diagnosis not present

## 2017-12-13 DIAGNOSIS — R195 Other fecal abnormalities: Secondary | ICD-10-CM

## 2017-12-13 HISTORY — PX: COLONOSCOPY: SHX5424

## 2017-12-13 HISTORY — PX: POLYPECTOMY: SHX5525

## 2017-12-13 SURGERY — COLONOSCOPY
Anesthesia: Moderate Sedation

## 2017-12-13 MED ORDER — ONDANSETRON HCL 4 MG/2ML IJ SOLN
INTRAMUSCULAR | Status: AC
  Filled 2017-12-13: qty 2

## 2017-12-13 MED ORDER — MEPERIDINE HCL 50 MG/ML IJ SOLN
INTRAMUSCULAR | Status: AC
  Filled 2017-12-13: qty 1

## 2017-12-13 MED ORDER — STERILE WATER FOR IRRIGATION IR SOLN
Status: DC | PRN
  Administered 2017-12-13: 1.5 mL

## 2017-12-13 MED ORDER — ONDANSETRON HCL 4 MG/2ML IJ SOLN
INTRAMUSCULAR | Status: DC | PRN
  Administered 2017-12-13: 4 mg via INTRAVENOUS

## 2017-12-13 MED ORDER — MEPERIDINE HCL 100 MG/ML IJ SOLN
INTRAMUSCULAR | Status: DC | PRN
  Administered 2017-12-13: 15 mg via INTRAVENOUS
  Administered 2017-12-13: 25 mg

## 2017-12-13 MED ORDER — MIDAZOLAM HCL 5 MG/5ML IJ SOLN
INTRAMUSCULAR | Status: AC
  Filled 2017-12-13: qty 10

## 2017-12-13 MED ORDER — MIDAZOLAM HCL 5 MG/5ML IJ SOLN
INTRAMUSCULAR | Status: DC | PRN
  Administered 2017-12-13 (×2): 1 mg via INTRAVENOUS
  Administered 2017-12-13: 2 mg via INTRAVENOUS
  Administered 2017-12-13 (×2): 1 mg via INTRAVENOUS

## 2017-12-13 MED ORDER — SODIUM CHLORIDE 0.9 % IV SOLN
INTRAVENOUS | Status: DC
  Administered 2017-12-13: 12:00:00 via INTRAVENOUS

## 2017-12-13 NOTE — Interval H&P Note (Signed)
History and Physical Interval Note:  12/13/2017 11:56 AM  Joseph Rodgers  has presented today for surgery, with the diagnosis of heme+ stool  The various methods of treatment have been discussed with the patient and family. After consideration of risks, benefits and other options for treatment, the patient has consented to  Procedure(s) with comments: COLONOSCOPY (N/A) - 2:15pm as a surgical intervention .  The patient's history has been reviewed, patient examined, no change in status, stable for surgery.  I have reviewed the patient's chart and labs.  Questions were answered to the patient's satisfaction.     Sion Thane  No change.  Diagnostic colonoscopy per plan.  The risks, benefits, limitations, alternatives and imponderables have been reviewed with the patient. Questions have been answered. All parties are agreeable.

## 2017-12-13 NOTE — Discharge Instructions (Signed)
Diverticulosis Diverticulosis is a condition that develops when small pouches (diverticula) form in the wall of the large intestine (colon). The colon is where water is absorbed and stool is formed. The pouches form when the inside layer of the colon pushes through weak spots in the outer layers of the colon. You may have a few pouches or many of them. What are the causes? The cause of this condition is not known. What increases the risk? The following factors may make you more likely to develop this condition: Being older than age 57. Your risk for this condition increases with age. Diverticulosis is rare among people younger than age 24. By age 33, many people have it. Eating a low-fiber diet. Having frequent constipation. Being overweight. Not getting enough exercise. Smoking. Taking over-the-counter pain medicines, like aspirin and ibuprofen. Having a family history of diverticulosis.  What are the signs or symptoms? In most people, there are no symptoms of this condition. If you do have symptoms, they may include: Bloating. Cramps in the abdomen. Constipation or diarrhea. Pain in the lower left side of the abdomen.  How is this diagnosed? This condition is most often diagnosed during an exam for other colon problems. Because diverticulosis usually has no symptoms, it often cannot be diagnosed independently. This condition may be diagnosed by: Using a flexible scope to examine the colon (colonoscopy). Taking an X-ray of the colon after dye has been put into the colon (barium enema). Doing a CT scan.  How is this treated? You may not need treatment for this condition if you have never developed an infection related to diverticulosis. If you have had an infection before, treatment may include: Eating a high-fiber diet. This may include eating more fruits, vegetables, and grains. Taking a fiber supplement. Taking a live bacteria supplement (probiotic). Taking medicine to relax your  colon. Taking antibiotic medicines.  Follow these instructions at home: Drink 6-8 glasses of water or more each day to prevent constipation. Try not to strain when you have a bowel movement. If you have had an infection before: Eat more fiber as directed by your health care provider or your diet and nutrition specialist (dietitian). Take a fiber supplement or probiotic, if your health care provider approves. Take over-the-counter and prescription medicines only as told by your health care provider. If you were prescribed an antibiotic, take it as told by your health care provider. Do not stop taking the antibiotic even if you start to feel better. Keep all follow-up visits as told by your health care provider. This is important. Contact a health care provider if: You have pain in your abdomen. You have bloating. You have cramps. You have not had a bowel movement in 3 days. Get help right away if: Your pain gets worse. Your bloating becomes very bad. You have a fever or chills, and your symptoms suddenly get worse. You vomit. You have bowel movements that are bloody or black. You have bleeding from your rectum. Summary Diverticulosis is a condition that develops when small pouches (diverticula) form in the wall of the large intestine (colon). You may have a few pouches or many of them. This condition is most often diagnosed during an exam for other colon problems. If you have had an infection related to diverticulosis, treatment may include increasing the fiber in your diet, taking supplements, or taking medicines. This information is not intended to replace advice given to you by your health care provider. Make sure you discuss any questions you have  with your health care provider. Document Released: 10/29/2003 Document Revised: 12/21/2015 Document Reviewed: 12/21/2015 Elsevier Interactive Patient Education  2017 [employer]. Colon Polyps Polyps are tissue growths inside the body.  Polyps can grow in many places, including the large intestine (colon). A polyp may be a round bump or a mushroom-shaped growth. You could have one polyp or several. Most colon polyps are noncancerous (benign). However, some colon polyps can become cancerous over time. What are the causes? The exact cause of colon polyps is not known. What increases the risk? This condition is more likely to develop in people who:  Have a family history of colon cancer or colon polyps.  Are older than 43 or older than 45 if they are African American.  Have inflammatory bowel disease, such as ulcerative colitis or Crohn disease.  Are overweight.  Smoke cigarettes.  Do not get enough exercise.  Drink too much alcohol.  Eat a diet that is: ? High in fat and red meat. ? Low in fiber.  Had childhood cancer that was treated with abdominal radiation.  What are the signs or symptoms? Most polyps do not cause symptoms. If you have symptoms, they may include:  Blood coming from your rectum when having a bowel movement.  Blood in your stool.The stool may look dark red or black.  A change in bowel habits, such as constipation or diarrhea.  How is this diagnosed? This condition is diagnosed with a colonoscopy. This is a procedure that uses a lighted, flexible scope to look at the inside of your colon. How is this treated? Treatment for this condition involves removing any polyps that are found. Those polyps will then be tested for cancer. If cancer is found, your health care provider will talk to you about options for colon cancer treatment. Follow these instructions at home: Diet  Eat plenty of fiber, such as fruits, vegetables, and whole grains.  Eat foods that are high in calcium and vitamin D, such as milk, cheese, yogurt, eggs, liver, fish, and broccoli.  Limit foods high in fat, red meats, and processed meats, such as hot dogs, sausage, bacon, and lunch meats.  Maintain a healthy weight, or  lose weight if recommended by your health care provider. General instructions  Do not smoke cigarettes.  Do not drink alcohol excessively.  Keep all follow-up visits as told by your health care provider. This is important. This includes keeping regularly scheduled colonoscopies. Talk to your health care provider about when you need a colonoscopy.  Exercise every day or as told by your health care provider. Contact a health care provider if:  You have new or worsening bleeding during a bowel movement.  You have new or increased blood in your stool.  You have a change in bowel habits.  You unexpectedly lose weight. This information is not intended to replace advice given to you by your health care provider. Make sure you discuss any questions you have with your health care provider. Document Released: 10/28/2003 Document Revised: 07/09/2015 Document Reviewed: 12/22/2014 Elsevier Interactive Patient Education  2018 [employer].  Colonoscopy Discharge Instructions  Read the instructions outlined below and refer to this sheet in the next few weeks. These discharge instructions provide you with general information on caring for yourself after you leave the hospital. Your doctor may also give you specific instructions. While your treatment has been planned according to the most current medical practices available, unavoidable complications occasionally occur. If you have any problems or questions after discharge,  call Dr. Gala Romney at [phone]. ACTIVITY  You may resume your regular activity, but move at a slower pace for the next 24 hours.   Take frequent rest periods for the next 24 hours.   Walking will help get rid of the air and reduce the bloated feeling in your belly (abdomen).   No driving for 24 hours (because of the medicine (anesthesia) used during the test).    Do not sign any important legal documents or operate any machinery for 24 hours (because of the anesthesia used during  the test).  NUTRITION  Drink plenty of fluids.   You may resume your normal diet as instructed by your doctor.   Begin with a light meal and progress to your normal diet. Heavy or fried foods are harder to digest and may make you feel sick to your stomach (nauseated).   Avoid alcoholic beverages for 24 hours or as instructed.  MEDICATIONS  You may resume your normal medications unless your doctor tells you otherwise.  WHAT YOU CAN EXPECT TODAY  Some feelings of bloating in the abdomen.   Passage of more gas than usual.   Spotting of blood in your stool or on the toilet paper.  IF YOU HAD POLYPS REMOVED DURING THE COLONOSCOPY:  No aspirin products for 7 days or as instructed.   No alcohol for 7 days or as instructed.   Eat a soft diet for the next 24 hours.  FINDING OUT THE RESULTS OF YOUR TEST Not all test results are available during your visit. If your test results are not back during the visit, make an appointment with your caregiver to find out the results. Do not assume everything is normal if you have not heard from your caregiver or the medical facility. It is important for you to follow up on all of your test results.  SEEK IMMEDIATE MEDICAL ATTENTION IF:  You have more than a spotting of blood in your stool.   Your belly is swollen (abdominal distention).   You are nauseated or vomiting.   You have a temperature over 101.   You have abdominal pain or discomfort that is severe or gets worse throughout the day.    The polyp and diverticulosis information provided  Further recommendations to follow pending review of pathology report

## 2017-12-13 NOTE — Op Note (Signed)
[facility] Patient Name: Joseph Rodgers Procedure Date: 12/13/2017 11:35 AM MRN: [id number] Date of Birth: 1950-09-10 Attending MD: Norvel Richards , MD CSN: [id number] Age: 67 Admit Type: Outpatient Procedure:                Colonoscopy Indications:              Heme positive stool Providers:                Norvel Richards, MD, Lurline Del, RN, Aram Candela Referring MD:              Medicines:                Midazolam 4 mg IV, Meperidine 40 mg IV Complications:            No immediate complications. Estimated Blood Loss:     Estimated blood loss was minimal. Procedure:                Pre-Anesthesia Assessment:                           - Prior to the procedure, a History and Physical                            was performed, and patient medications and                            allergies were reviewed. The patient's tolerance of                            previous anesthesia was also reviewed. The risks                            and benefits of the procedure and the sedation                            options and risks were discussed with the patient.                            All questions were answered, and informed consent                            was obtained. Prior Anticoagulants: The patient has                            taken no previous anticoagulant or antiplatelet                            agents. ASA Grade Assessment: II - A patient with                            mild systemic disease. After reviewing the risks  and benefits, the patient was deemed in                            satisfactory condition to undergo the procedure.                           After obtaining informed consent, the colonoscope                            was passed under direct vision. Throughout the                            procedure, the patient's blood pressure, pulse, and                            oxygen saturations  were monitored continuously. The                            CF-[id number] ([id number]) scope was introduced through                            the anus and advanced to the the cecum, identified                            by appendiceal orifice and ileocecal valve. The                            colonoscopy was performed without difficulty. The                            patient tolerated the procedure well. The quality                            of the bowel preparation was adequate. The                            ileocecal valve, appendiceal orifice, and rectum                            were photographed. Scope In: 12:07:45 PM Scope Out: 12:20:10 PM Scope Withdrawal Time: 0 hours 8 minutes 12 seconds  Total Procedure Duration: 0 hours 12 minutes 25 seconds  Findings:      The perianal and digital rectal examinations were normal.      Two sessile polyps were found in the descending colon and hepatic       flexure. The polyps were 5 to 6 mm in size. These polyps were removed       with a cold snare. Resection and retrieval were complete. Estimated       blood loss was minimal.      The exam was otherwise without abnormality on direct and retroflexion       views.      Multiple small and large-mouthed diverticula were found in the sigmoid       colon and descending colon.      The exam was otherwise  without abnormality on direct and retroflexion       views. Impression:               - Two 5 to 6 mm polyps in the descending colon and                            at the hepatic flexure, removed with a cold snare.                            Resected and retrieved.                           - Diverticulosis in the sigmoid colon and in the                            descending colon.                           - The examination was otherwise normal on direct                            and retroflexion views. Moderate Sedation:      Moderate (conscious) sedation was administered by the endoscopy  nurse       and supervised by the endoscopist. The following parameters were       monitored: oxygen saturation, heart rate, blood pressure, respiratory       rate, EKG, adequacy of pulmonary ventilation, and response to care.       Total physician intraservice time was 20 minutes. Recommendation:           - Patient has a contact number available for                            emergencies. The signs and symptoms of potential                            delayed complications were discussed with the                            patient. Return to normal activities tomorrow.                            Written discharge instructions were provided to the                            patient.                           - Resume previous diet.                           - Continue present medications.                           - Repeat colonoscopy date to be determined after  pending pathology results are reviewed for                            surveillance.                           - Return to GI office after studies are complete. Procedure Code(s):        --- Professional ---                           [phone], Colonoscopy, flexible; with removal of                            tumor(s), polyp(s), or other lesion(s) by snare                            technique                           G0500, Moderate sedation services provided by the                            same physician or other qualified health care                            professional performing a gastrointestinal                            endoscopic service that sedation supports,                            requiring the presence of an independent trained                            observer to assist in the monitoring of the                            patient's level of consciousness and physiological                            status; initial 15 minutes of intra-service time;                            patient  age 6 years or older (additional time may                            be reported with [phone], as appropriate) Diagnosis Code(s):        --- Professional ---                           D12.4, Benign neoplasm of descending colon                           D12.3, Benign neoplasm of transverse colon (hepatic  flexure or splenic flexure)                           R19.5, Other fecal abnormalities                           K57.30, Diverticulosis of large intestine without                            perforation or abscess without bleeding CPT copyright 2018 American Medical Association. All rights reserved. The codes documented in this report are preliminary and upon coder review may  be revised to meet current compliance requirements. Cristopher Estimable. Raymir Frommelt, MD Norvel Richards, MD 12/13/2017 12:39:23 PM This report has been signed electronically. Number of Addenda: 0

## 2017-12-18 ENCOUNTER — Encounter (HOSPITAL_COMMUNITY): Payer: Self-pay | Admitting: Internal Medicine

## 2017-12-21 ENCOUNTER — Encounter: Payer: Self-pay | Admitting: Internal Medicine

## 2018-01-26 ENCOUNTER — Ambulatory Visit: Payer: 59 | Admitting: Gastroenterology

## 2018-04-24 ENCOUNTER — Encounter: Payer: Self-pay | Admitting: Family Medicine

## 2018-04-24 ENCOUNTER — Ambulatory Visit (INDEPENDENT_AMBULATORY_CARE_PROVIDER_SITE_OTHER): Payer: Medicare Other | Admitting: Family Medicine

## 2018-04-24 VITALS — BP 148/78 | Ht 65.75 in | Wt 209.0 lb

## 2018-04-24 DIAGNOSIS — E7849 Other hyperlipidemia: Secondary | ICD-10-CM | POA: Diagnosis not present

## 2018-04-24 DIAGNOSIS — I10 Essential (primary) hypertension: Secondary | ICD-10-CM

## 2018-04-24 MED ORDER — LISINOPRIL 20 MG PO TABS: 20.0000 mg | ORAL_TABLET | Freq: Every day | ORAL | 1 refills | Status: DC

## 2018-04-24 MED ORDER — AMLODIPINE BESYLATE 2.5 MG PO TABS: 2.5000 mg | ORAL_TABLET | Freq: Every day | ORAL | 1 refills | Status: DC

## 2018-04-24 NOTE — Patient Instructions (Signed)
DASH Eating Plan  DASH stands for "Dietary Approaches to Stop Hypertension." The DASH eating plan is a healthy eating plan that has been shown to reduce high blood pressure (hypertension). It may also reduce your risk for type 2 diabetes, heart disease, and stroke. The DASH eating plan may also help with weight loss.  What are tips for following this plan?    General guidelines   Avoid eating more than 2,300 mg (milligrams) of salt (sodium) a day. If you have hypertension, you may need to reduce your sodium intake to 1,500 mg a day.   Limit alcohol intake to no more than 1 drink a day for nonpregnant women and 2 drinks a day for men. One drink equals 12 oz of beer, 5 oz of wine, or 1 oz of hard liquor.   Work with your health care provider to maintain a healthy body weight or to lose weight. Ask what an ideal weight is for you.   Get at least 30 minutes of exercise that causes your heart to beat faster (aerobic exercise) most days of the week. Activities may include walking, swimming, or biking.   Work with your health care provider or diet and nutrition specialist (dietitian) to adjust your eating plan to your individual calorie needs.  Reading food labels     Check food labels for the amount of sodium per serving. Choose foods with less than 5 percent of the Daily Value of sodium. Generally, foods with less than 300 mg of sodium per serving fit into this eating plan.   To find whole grains, look for the word "whole" as the first word in the ingredient list.  Shopping   Buy products labeled as "low-sodium" or "no salt added."   Buy fresh foods. Avoid canned foods and premade or frozen meals.  Cooking   Avoid adding salt when cooking. Use salt-free seasonings or herbs instead of table salt or sea salt. Check with your health care provider or pharmacist before using salt substitutes.   Do not fry foods. Cook foods using healthy methods such as baking, boiling, grilling, and broiling instead.   Cook with  heart-healthy oils, such as olive, canola, soybean, or sunflower oil.  Meal planning   Eat a balanced diet that includes:  ? 5 or more servings of fruits and vegetables each day. At each meal, try to fill half of your plate with fruits and vegetables.  ? Up to 6-8 servings of whole grains each day.  ? Less than 6 oz of lean meat, poultry, or fish each day. A 3-oz serving of meat is about the same size as a deck of cards. One egg equals 1 oz.  ? 2 servings of low-fat dairy each day.  ? A serving of nuts, seeds, or beans 5 times each week.  ? Heart-healthy fats. Healthy fats called Omega-3 fatty acids are found in foods such as flaxseeds and coldwater fish, like sardines, salmon, and mackerel.   Limit how much you eat of the following:  ? Canned or prepackaged foods.  ? Food that is high in trans fat, such as fried foods.  ? Food that is high in saturated fat, such as fatty meat.  ? Sweets, desserts, sugary drinks, and other foods with added sugar.  ? Full-fat dairy products.   Do not salt foods before eating.   Try to eat at least 2 vegetarian meals each week.   Eat more home-cooked food and less restaurant, buffet, and fast food.     When eating at a restaurant, ask that your food be prepared with less salt or no salt, if possible.  What foods are recommended?  The items listed may not be a complete list. Talk with your dietitian about what dietary choices are best for you.  Grains  Whole-grain or whole-wheat bread. Whole-grain or whole-wheat pasta. Brown rice. Oatmeal. Quinoa. Bulgur. Whole-grain and low-sodium cereals. Pita bread. Low-fat, low-sodium crackers. Whole-wheat flour tortillas.  Vegetables  Fresh or frozen vegetables (raw, steamed, roasted, or grilled). Low-sodium or reduced-sodium tomato and vegetable juice. Low-sodium or reduced-sodium tomato sauce and tomato paste. Low-sodium or reduced-sodium canned vegetables.  Fruits  All fresh, dried, or frozen fruit. Canned fruit in natural juice (without  added sugar).  Meat and other protein foods  Skinless chicken or turkey. Ground chicken or turkey. Pork with fat trimmed off. Fish and seafood. Egg whites. Dried beans, peas, or lentils. Unsalted nuts, nut butters, and seeds. Unsalted canned beans. Lean cuts of beef with fat trimmed off. Low-sodium, lean deli meat.  Dairy  Low-fat (1%) or fat-free (skim) milk. Fat-free, low-fat, or reduced-fat cheeses. Nonfat, low-sodium ricotta or cottage cheese. Low-fat or nonfat yogurt. Low-fat, low-sodium cheese.  Fats and oils  Soft margarine without trans fats. Vegetable oil. Low-fat, reduced-fat, or light mayonnaise and salad dressings (reduced-sodium). Canola, safflower, olive, soybean, and sunflower oils. Avocado.  Seasoning and other foods  Herbs. Spices. Seasoning mixes without salt. Unsalted popcorn and pretzels. Fat-free sweets.  What foods are not recommended?  The items listed may not be a complete list. Talk with your dietitian about what dietary choices are best for you.  Grains  Baked goods made with fat, such as croissants, muffins, or some breads. Dry pasta or rice meal packs.  Vegetables  Creamed or fried vegetables. Vegetables in a cheese sauce. Regular canned vegetables (not low-sodium or reduced-sodium). Regular canned tomato sauce and paste (not low-sodium or reduced-sodium). Regular tomato and vegetable juice (not low-sodium or reduced-sodium). Pickles. Olives.  Fruits  Canned fruit in a light or heavy syrup. Fried fruit. Fruit in cream or butter sauce.  Meat and other protein foods  Fatty cuts of meat. Ribs. Fried meat. Bacon. Sausage. Bologna and other processed lunch meats. Salami. Fatback. Hotdogs. Bratwurst. Salted nuts and seeds. Canned beans with added salt. Canned or smoked fish. Whole eggs or egg yolks. Chicken or turkey with skin.  Dairy  Whole or 2% milk, cream, and half-and-half. Whole or full-fat cream cheese. Whole-fat or sweetened yogurt. Full-fat cheese. Nondairy creamers. Whipped toppings.  Processed cheese and cheese spreads.  Fats and oils  Butter. Stick margarine. Lard. Shortening. Ghee. Bacon fat. Tropical oils, such as coconut, palm kernel, or palm oil.  Seasoning and other foods  Salted popcorn and pretzels. Onion salt, garlic salt, seasoned salt, table salt, and sea salt. Worcestershire sauce. Tartar sauce. Barbecue sauce. Teriyaki sauce. Soy sauce, including reduced-sodium. Steak sauce. Canned and packaged gravies. Fish sauce. Oyster sauce. Cocktail sauce. Horseradish that you find on the shelf. Ketchup. Mustard. Meat flavorings and tenderizers. Bouillon cubes. Hot sauce and Tabasco sauce. Premade or packaged marinades. Premade or packaged taco seasonings. Relishes. Regular salad dressings.  Where to find more information:   National Heart, Lung, and Blood Institute: www.nhlbi.nih.gov   American Heart Association: www.heart.org  Summary   The DASH eating plan is a healthy eating plan that has been shown to reduce high blood pressure (hypertension). It may also reduce your risk for type 2 diabetes, heart disease, and stroke.   With the   DASH eating plan, you should limit salt (sodium) intake to 2,300 mg a day. If you have hypertension, you may need to reduce your sodium intake to 1,500 mg a day.   When on the DASH eating plan, aim to eat more fresh fruits and vegetables, whole grains, lean proteins, low-fat dairy, and heart-healthy fats.   Work with your health care provider or diet and nutrition specialist (dietitian) to adjust your eating plan to your individual calorie needs.  This information is not intended to replace advice given to you by your health care provider. Make sure you discuss any questions you have with your health care provider.  Document Released: 01/20/2011 Document Revised: 01/25/2016 Document Reviewed: 01/25/2016  Elsevier Interactive Patient Education  2019 [employer]

## 2018-04-24 NOTE — Progress Notes (Signed)
   Subjective:    Patient ID: Joseph Rodgers, male    DOB: [birth year], 68 y.o.   MRN: [id number]  HPI Patient is here today to follow up on his chronic health issues.  Hypertension:He takes Norvasc 2.5 mg once per day.Lisinopril 20 mg per day. Patient for blood pressure check up.  The patient does have hypertension.  The patient is on medication.  Patient relates compliance with meds. Todays BP reviewed with the patient. Patient denies issues with medication. Patient relates reasonable diet. Patient tries to minimize salt. Patient aware of BP goals.  Hyperlipidemia:Diet controlled Patient does not tolerate lipid medicines.  Tries to do his best with watching diet  Gout:Diet controlled.  He eats healthy and exercises.  He does not see any specialist.     Review of Systems  Constitutional: Negative for diaphoresis and fatigue.  HENT: Negative for congestion and rhinorrhea.   Respiratory: Negative for cough and shortness of breath.   Cardiovascular: Negative for chest pain and leg swelling.  Gastrointestinal: Negative for abdominal pain and diarrhea.  Skin: Negative for color change and rash.  Neurological: Negative for dizziness and headaches.  Psychiatric/Behavioral: Negative for behavioral problems and confusion.       Objective:   Physical Exam Vitals signs reviewed.  Constitutional:      General: He is not in acute distress. HENT:     Head: Normocephalic and atraumatic.  Eyes:     General:        Right eye: No discharge.        Left eye: No discharge.  Neck:     Trachea: No tracheal deviation.  Cardiovascular:     Rate and Rhythm: Normal rate and regular rhythm.     Heart sounds: Normal heart sounds. No murmur.  Pulmonary:     Effort: Pulmonary effort is normal. No respiratory distress.     Breath sounds: Normal breath sounds.  Lymphadenopathy:     Cervical: No cervical adenopathy.  Skin:    General: Skin is warm and dry.  Neurological:     Mental Status: He  is alert.     Coordination: Coordination normal.  Psychiatric:        Behavior: Behavior normal.           Assessment & Plan:  HTN- Patient was seen today as part of a visit regarding hypertension. The importance of healthy diet and regular physical activity was discussed. The importance of compliance with medications discussed.  Ideal goal is to keep blood pressure low elevated levels certainly below 751/02 when possible.  The patient was counseled that keeping blood pressure under control lessen his risk of complications.  The importance of regular follow-ups was discussed with the patient.  Low-salt diet such as DASH recommended.  Regular physical activity was recommended as well.  Patient was advised to keep regular follow-ups.  Hyperlipidemia does not tolerate statins very important for the patient watch diet try to keep things under good control follow-up in the fall for wellness check along with blood pressure check  Patient does have element of whitecoat elevation of blood pressure his readings at home usually 130/70

## 2018-06-13 ENCOUNTER — Other Ambulatory Visit: Payer: Self-pay

## 2018-06-13 ENCOUNTER — Ambulatory Visit (INDEPENDENT_AMBULATORY_CARE_PROVIDER_SITE_OTHER): Payer: Medicare Other | Admitting: Family Medicine

## 2018-06-13 DIAGNOSIS — I10 Essential (primary) hypertension: Secondary | ICD-10-CM

## 2018-06-13 DIAGNOSIS — K2900 Acute gastritis without bleeding: Secondary | ICD-10-CM

## 2018-06-13 DIAGNOSIS — R030 Elevated blood-pressure reading, without diagnosis of hypertension: Secondary | ICD-10-CM

## 2018-06-13 NOTE — Progress Notes (Signed)
   Subjective:    Patient ID: Joseph Rodgers, male    DOB: 1950-06-30, 68 y.o.   MRN: [id number]  HPI Format-phone  Patient present at home Provider present at office Consent for interaction obtained Coronavirus outbreak made virtual visit necessary   Patient calls to discuss his blood pressure. Patient states his blood pressure today was 134/73 but the last 2 days it was 148/84 and 156/85 Patient sates he has also had some pain on left side of stomach at times and feels it may be related to worry. Patient states the last few weeks have been rough.   Virtual Visit via Video Note  I connected with Joseph Rodgers on 06/13/18 at  3:00 PM EDT by a video enabled telemedicine application and verified that I am speaking with the correct person using two identifiers.   I discussed the limitations of evaluation and management by telemedicine and the availability of in person appointments. The patient expressed understanding and agreed to proceed.  History of Present Illness:    Observations/Objective:   Assessment and Plan:   Follow Up Instructions:    I discussed the assessment and treatment plan with the patient. The patient was provided an opportunity to ask questions and all were answered. The patient agreed with the plan and demonstrated an understanding of the instructions.   The patient was advised to call back or seek an in-person evaluation if the symptoms worsen or if the condition fails to improve as anticipated.  I provided 15 minutes of non-face-to-face time during this encounter.     Review of Systems     Objective:   Physical Exam We had a good discussion regarding his blood pressure recently has not been eating quite as healthy as normal in addition to this also states not exercising as much as normal therefore he states he will pick up better eating habits minimize salt and he also pick up more walking and hopefully this will get blood pressure under better  control  We also discussed how to avoid getting sick with coronavirus and minimizing contact with others  Patient also relates some intermittent epigastric discomfort and relates a little burning sensation and only occurs occasionally.  He denies any hematemesis or melena he states that he will be willing to try Tums to see if it will help if it does not help enough he will notify us     Assessment & Plan:  Elevated blood pressure He is already on medication I believe if he does a better job with diet and exercise he will keep it under good control certainly if it does not get under better control over the next 10 to 14 days we would need to bump up the dose of his medicine  We will connect with the patient in approximately 10 to 14 days to do a follow-up regarding his blood pressure he agrees to this  Mild gastritis issues try Tums if he has ongoing troubles we will need to utilize a PPI

## 2018-06-27 ENCOUNTER — Other Ambulatory Visit: Payer: Self-pay

## 2018-06-27 ENCOUNTER — Ambulatory Visit (INDEPENDENT_AMBULATORY_CARE_PROVIDER_SITE_OTHER): Payer: Medicare Other | Admitting: Family Medicine

## 2018-06-27 DIAGNOSIS — I10 Essential (primary) hypertension: Secondary | ICD-10-CM

## 2018-06-27 DIAGNOSIS — E7849 Other hyperlipidemia: Secondary | ICD-10-CM

## 2018-06-27 NOTE — Progress Notes (Signed)
   Subjective:    Patient ID: Joseph Rodgers, male    DOB: Jun 26, 1950, 68 y.o.   MRN: [id number]  Hypertension  This is a chronic problem. Pertinent negatives include no chest pain, headaches or shortness of breath. Treatments tried: taking amlodipine 2.5mg  and lisinopril 20mg . There are no compliance problems (takes meds every day, walks for exercise).    bp yesterday per pt 125/68. Pt states it has been running good for the past two weeks but before that it was elevated. Around 156/83.   Virtual Visit via Telephone Note  I connected with [name] on 06/27/18 at 10:00 AM EDT by telephone and verified that I am speaking with the correct person using two identifiers.  Location: Patient: home Provider: office   I discussed the limitations, risks, security and privacy concerns of performing an evaluation and management service by telephone and the availability of in person appointments. I also discussed with the patient that there may be a patient responsible charge related to this service. The patient expressed understanding and agreed to proceed.   History of Present Illness:    Observations/Objective:   Assessment and Plan:   Follow Up Instructions:    I discussed the assessment and treatment plan with the patient. The patient was provided an opportunity to ask questions and all were answered. The patient agreed with the plan and demonstrated an understanding of the instructions.   The patient was advised to call back or seek an in-person evaluation if the symptoms worsen or if the condition fails to improve as anticipated.  I provided 10 minutes of non-face-to-face time during this encounter.       Review of Systems  Constitutional: Negative for activity change, fatigue and fever.  HENT: Negative for congestion and rhinorrhea.   Respiratory: Negative for cough and shortness of breath.   Cardiovascular: Negative for chest pain and leg swelling.  Gastrointestinal:  Negative for abdominal pain, diarrhea and nausea.  Genitourinary: Negative for dysuria and hematuria.  Neurological: Negative for weakness and headaches.  Psychiatric/Behavioral: Negative for agitation and behavioral problems.       Objective:   Physical Exam Tele visit no PE       Assessment & Plan:  HTN- Patient was seen today as part of a visit regarding hypertension. The importance of healthy diet and regular physical activity was discussed. The importance of compliance with medications discussed.  Ideal goal is to keep blood pressure low elevated levels certainly below 500/93 when possible.  The patient was counseled that keeping blood pressure under control lessen his risk of complications.  The importance of regular follow-ups was discussed with the patient.  Low-salt diet such as DASH recommended.  Regular physical activity was recommended as well.  Patient was advised to keep regular follow-ups.  Doing well on meds continue as is Ov later near fall for PE and chronic

## 2018-09-09 ENCOUNTER — Other Ambulatory Visit: Payer: Self-pay | Admitting: Family Medicine

## 2018-10-09 DIAGNOSIS — E7849 Other hyperlipidemia: Secondary | ICD-10-CM | POA: Diagnosis not present

## 2018-10-09 DIAGNOSIS — I10 Essential (primary) hypertension: Secondary | ICD-10-CM | POA: Diagnosis not present

## 2018-10-10 LAB — LIPID PANEL
Chol/HDL Ratio: 5.8 ratio — ABNORMAL HIGH (ref 0.0–5.0)
Cholesterol, Total: 238 mg/dL — ABNORMAL HIGH (ref 100–199)
HDL: 41 mg/dL (ref >39)
LDL Calculated: 148 mg/dL — ABNORMAL HIGH (ref 0–99)
Triglycerides: 247 mg/dL — ABNORMAL HIGH (ref 0–149)
VLDL Cholesterol Cal: 49 mg/dL — ABNORMAL HIGH (ref 5–40)

## 2018-10-10 LAB — BASIC METABOLIC PANEL
BUN/Creatinine Ratio: 20 (ref 10–24)
BUN: 22 mg/dL (ref 8–27)
CO2: 22 mmol/L (ref 20–29)
Calcium: 9.7 mg/dL (ref 8.6–10.2)
Chloride: 101 mmol/L (ref 96–106)
Creatinine, Ser: 1.09 mg/dL (ref 0.76–1.27)
GFR calc Af Amer: 80 mL/min/{1.73_m2} (ref >59)
GFR calc non Af Amer: 69 mL/min/{1.73_m2} (ref >59)
Glucose: 103 mg/dL — ABNORMAL HIGH (ref 65–99)
Potassium: 4.6 mmol/L (ref 3.5–5.2)
Sodium: 139 mmol/L (ref 134–144)

## 2018-10-10 LAB — HEPATIC FUNCTION PANEL
ALT: 32 IU/L (ref 0–44)
AST: 27 IU/L (ref 0–40)
Albumin: 4.8 g/dL (ref 3.8–4.8)
Alkaline Phosphatase: 63 IU/L (ref 39–117)
Bilirubin Total: 0.7 mg/dL (ref 0.0–1.2)
Bilirubin, Direct: 0.15 mg/dL (ref 0.00–0.40)
Total Protein: 7.1 g/dL (ref 6.0–8.5)

## 2018-10-10 LAB — PSA: Prostate Specific Ag, Serum: 1.6 ng/mL (ref 0.0–4.0)

## 2018-10-24 ENCOUNTER — Ambulatory Visit (INDEPENDENT_AMBULATORY_CARE_PROVIDER_SITE_OTHER): Payer: Medicare Other | Admitting: Family Medicine

## 2018-10-24 ENCOUNTER — Encounter: Payer: Self-pay | Admitting: Family Medicine

## 2018-10-24 ENCOUNTER — Other Ambulatory Visit: Payer: Self-pay

## 2018-10-24 VITALS — BP 136/84 | Temp 97.3°F | Ht 65.0 in | Wt 204.0 lb

## 2018-10-24 DIAGNOSIS — M722 Plantar fascial fibromatosis: Secondary | ICD-10-CM | POA: Diagnosis not present

## 2018-10-24 DIAGNOSIS — I10 Essential (primary) hypertension: Secondary | ICD-10-CM | POA: Diagnosis not present

## 2018-10-24 DIAGNOSIS — Z23 Encounter for immunization: Secondary | ICD-10-CM | POA: Diagnosis not present

## 2018-10-24 DIAGNOSIS — Z0000 Encounter for general adult medical examination without abnormal findings: Secondary | ICD-10-CM | POA: Diagnosis not present

## 2018-10-24 DIAGNOSIS — E7849 Other hyperlipidemia: Secondary | ICD-10-CM | POA: Diagnosis not present

## 2018-10-24 MED ORDER — LISINOPRIL 20 MG PO TABS: 20.0000 mg | ORAL_TABLET | Freq: Every day | ORAL | 1 refills | Status: DC

## 2018-10-24 MED ORDER — ZOSTER VAC RECOMB ADJUVANTED 50 MCG/0.5ML IM SUSR: 0.5000 mL | Freq: Once | INTRAMUSCULAR | 1 refills | Status: AC

## 2018-10-24 MED ORDER — AMLODIPINE BESYLATE 2.5 MG PO TABS: 2.5000 mg | ORAL_TABLET | Freq: Every day | ORAL | 1 refills | Status: DC

## 2018-10-24 NOTE — Patient Instructions (Addendum)
Results for orders placed or performed in visit on [id number]  Basic Metabolic Panel (BMET)  Result Value Ref Range   Glucose 103 (H) 65 - 99 mg/dL   BUN 22 8 - 27 mg/dL   Creatinine, Ser 1.09 0.76 - 1.27 mg/dL   GFR calc non Af Amer 69 >59 mL/min/1.73   GFR calc Af Amer 80 >59 mL/min/1.73   BUN/Creatinine Ratio 20 10 - 24   Sodium 139 134 - 144 mmol/L   Potassium 4.6 3.5 - 5.2 mmol/L   Chloride 101 96 - 106 mmol/L   CO2 22 20 - 29 mmol/L   Calcium 9.7 8.6 - 10.2 mg/dL  Lipid Profile  Result Value Ref Range   Cholesterol, Total 238 (H) 100 - 199 mg/dL   Triglycerides 247 (H) 0 - 149 mg/dL   HDL 41 >39 mg/dL   VLDL Cholesterol Cal 49 (H) 5 - 40 mg/dL   LDL Calculated 148 (H) 0 - 99 mg/dL   Chol/HDL Ratio 5.8 (H) 0.0 - 5.0 ratio  Hepatic function panel  Result Value Ref Range   Total Protein 7.1 6.0 - 8.5 g/dL   Albumin 4.8 3.8 - 4.8 g/dL   Bilirubin Total 0.7 0.0 - 1.2 mg/dL   Bilirubin, Direct 0.15 0.00 - 0.40 mg/dL   Alkaline Phosphatase 63 39 - 117 IU/L   AST 27 0 - 40 IU/L   ALT 32 0 - 44 IU/L  PSA  Result Value Ref Range   Prostate Specific Ag, Serum 1.6 0.0 - 4.0 ng/mL   Shingrix and shingles prevention: know the facts!   Shingrix is a very effective vaccine to prevent shingles.   Shingles is a reactivation of chickenpox -more than 99% of Americans born before 1980 have had chickenpox even if they do not remember it. One in every 10 people who get shingles have severe long-lasting nerve pain as a result.   33 out of a 100 older adults will get shingles if they are unvaccinated.     This vaccine is very important for your health This vaccine is indicated for anyone 50 years or older. You can get this vaccine even if you have already had shingles because you can get the disease more than once in a lifetime.  Your risk for shingles and its complications increases with age.  This vaccine has 2 doses.  The second dose would be 2 to 6 months after the first dose.  If  you had Zostavax vaccine in the past you should still get Shingrix. ( Zostavax is only 70% effective and it loses significant strength over a few years .)  This vaccine is given through the pharmacy.  The cost of the vaccine is through your insurance. The pharmacy can inform you of the total costs.  Common side effects including soreness in the arm, some redness and swelling, also some feel fatigue muscle soreness headache low-grade fever.  Side effects typically go away within 2 to 3 days. Remember-the pain from shingles can last a lifetime but these side effects of the vaccine will only last a few days at most. It is very important to get both doses in order to protect yourself fully.   Please get this vaccine at your earliest convenience at your trusted pharmacy.       Thank you for coming for your annual wellness visit.  Please follow through on any advice that was given to you by today's visit. Remember to maintain compliance with your medications as discussed  today.  Also remember it is important to eat a healthy diet and to stay physically active on a daily basis.  Please follow through with any testing or recommended followup office visits as was discussed today. You are due the following test coming up:  ALL- Colonoscopy-2024            Vaccines-look into Shingrix

## 2018-10-24 NOTE — Progress Notes (Signed)
Subjective:    Patient ID: Joseph Rodgers, male    DOB: Mar 07, 1950, 68 y.o.   MRN: [id number]  HPI AWV- Annual Wellness Visit Very nice patient Having some significant additional issues Patient does have heel pain on the left side hurts with walking Denies any injury to it It is worsened when he first puts weight on it Does seem to get little bit better as he moves around Also occurs in the morning time  Patient had elevated blood pressure earlier in the year but recently has been doing better patient does state he takes his medicine regular basis he is trying to cut back on portions is trying to lose weight he is also try to do the best he can minimizing salt and checks his blood pressure frequently his readings overall look under good control  Patient also has history of fatty liver he is interested in knowing how his liver enzymes are going. The patient was seen for their annual wellness visit. The patient's past medical history, surgical history, and family history were reviewed. Pertinent vaccines were reviewed ( tetanus, pneumonia, shingles, flu) The patient's medication list was reviewed and updated.  The height and weight were entered.  BMI recorded in electronic record elsewhere  Cognitive screening was completed. Outcome of Mini - Cog: pass   Falls /depression screening electronically recorded within record elsewhere  Current tobacco usage: none (All patients who use tobacco were given written and verbal information on quitting)  Recent listing of emergency department/hospitalizations over the past year were reviewed.  current specialist the patient sees on a regular basis: none   Medicare annual wellness visit patient questionnaire was reviewed.  A written screening schedule for the patient for the next 5-10 years was given. Appropriate discussion of followup regarding next visit was discussed.   Left heel pain for about a month or so.   Review of Systems   Constitutional: Negative for activity change, appetite change and fever.  HENT: Negative for congestion and rhinorrhea.   Eyes: Negative for discharge.  Respiratory: Negative for cough and wheezing.   Cardiovascular: Negative for chest pain.  Gastrointestinal: Negative for abdominal pain, blood in stool and vomiting.  Genitourinary: Negative for difficulty urinating and frequency.  Musculoskeletal: Negative for neck pain.  Skin: Negative for rash.  Allergic/Immunologic: Negative for environmental allergies and food allergies.  Neurological: Negative for weakness and headaches.  Psychiatric/Behavioral: Negative for agitation.       Objective:   Physical Exam Constitutional:      Appearance: He is well-developed.  HENT:     Head: Normocephalic and atraumatic.     Right Ear: External ear normal.     Left Ear: External ear normal.     Nose: Nose normal.  Eyes:     Pupils: Pupils are equal, round, and reactive to light.  Neck:     Musculoskeletal: Normal range of motion and neck supple.     Thyroid: No thyromegaly.  Cardiovascular:     Rate and Rhythm: Normal rate and regular rhythm.     Heart sounds: Normal heart sounds. No murmur.  Pulmonary:     Effort: Pulmonary effort is normal. No respiratory distress.     Breath sounds: Normal breath sounds. No wheezing.  Abdominal:     General: Bowel sounds are normal. There is no distension.     Palpations: Abdomen is soft. There is no mass.     Tenderness: There is no abdominal tenderness.  Genitourinary:    Penis:  Normal.   Musculoskeletal: Normal range of motion.  Lymphadenopathy:     Cervical: No cervical adenopathy.  Skin:    General: Skin is warm and dry.     Findings: No erythema.  Neurological:     Mental Status: He is alert.     Motor: No abnormal muscle tone.  Psychiatric:        Behavior: Behavior normal.        Judgment: Judgment normal.     Results for orders placed or performed in visit on 123456  Basic  Metabolic Panel (BMET)  Result Value Ref Range   Glucose 103 (H) 65 - 99 mg/dL   BUN 22 8 - 27 mg/dL   Creatinine, Ser 1.09 0.76 - 1.27 mg/dL   GFR calc non Af Amer 69 >59 mL/min/1.73   GFR calc Af Amer 80 >59 mL/min/1.73   BUN/Creatinine Ratio 20 10 - 24   Sodium 139 134 - 144 mmol/L   Potassium 4.6 3.5 - 5.2 mmol/L   Chloride 101 96 - 106 mmol/L   CO2 22 20 - 29 mmol/L   Calcium 9.7 8.6 - 10.2 mg/dL  Lipid Profile  Result Value Ref Range   Cholesterol, Total 238 (H) 100 - 199 mg/dL   Triglycerides 247 (H) 0 - 149 mg/dL   HDL 41 >39 mg/dL   VLDL Cholesterol Cal 49 (H) 5 - 40 mg/dL   LDL Calculated 148 (H) 0 - 99 mg/dL   Chol/HDL Ratio 5.8 (H) 0.0 - 5.0 ratio  Hepatic function panel  Result Value Ref Range   Total Protein 7.1 6.0 - 8.5 g/dL   Albumin 4.8 3.8 - 4.8 g/dL   Bilirubin Total 0.7 0.0 - 1.2 mg/dL   Bilirubin, Direct 0.15 0.00 - 0.40 mg/dL   Alkaline Phosphatase 63 39 - 117 IU/L   AST 27 0 - 40 IU/L   ALT 32 0 - 44 IU/L  PSA  Result Value Ref Range   Prostate Specific Ag, Serum 1.6 0.0 - 4.0 ng/mL   On foot exam he does have tenderness in the left heel.  More than likely the plantar fascia Prostate exam slight BPH no hard nodules      Assessment & Plan:  Adult wellness-complete.wellness physical was conducted today. Importance of diet and exercise were discussed in detail.  In addition to this a discussion regarding safety was also covered. We also reviewed over immunizations and gave recommendations regarding current immunization needed for age.  In addition to this additional areas were also touched on including: Preventative health exams needed:  Colonoscopy 2024  Patient was advised yearly wellness exam  HTN refills given on medicine watch diet stay physically active minimize salt continue medication refills given  Plantar fasciitis OTC anti-inflammatory stretching ice compresses additional information was printed for the patient  Hyperlipidemia does  not tolerate statins minimize fats in the diet stay physically active minimize starches watch portions try to lose weight  15 minutes was spent with patient today discussing healthcare issues which they came.  More than 50% of this visit-total duration of visit-was spent in counseling and coordination of care.  Please see diagnosis regarding the focus of this coordination and care

## 2018-12-20 DIAGNOSIS — Z1283 Encounter for screening for malignant neoplasm of skin: Secondary | ICD-10-CM | POA: Diagnosis not present

## 2018-12-20 DIAGNOSIS — L820 Inflamed seborrheic keratosis: Secondary | ICD-10-CM | POA: Diagnosis not present

## 2018-12-20 DIAGNOSIS — L430 Hypertrophic lichen planus: Secondary | ICD-10-CM | POA: Diagnosis not present

## 2018-12-20 DIAGNOSIS — D225 Melanocytic nevi of trunk: Secondary | ICD-10-CM | POA: Diagnosis not present

## 2019-01-16 ENCOUNTER — Ambulatory Visit (INDEPENDENT_AMBULATORY_CARE_PROVIDER_SITE_OTHER): Payer: Medicare Other | Admitting: Family Medicine

## 2019-01-16 ENCOUNTER — Encounter: Payer: Self-pay | Admitting: Family Medicine

## 2019-01-16 DIAGNOSIS — N410 Acute prostatitis: Secondary | ICD-10-CM

## 2019-01-16 MED ORDER — CIPROFLOXACIN HCL 500 MG PO TABS: 500.0000 mg | ORAL_TABLET | Freq: Two times a day (BID) | ORAL | 0 refills | Status: DC

## 2019-01-16 NOTE — Progress Notes (Signed)
   Subjective:  Audio only  Patient ID: Joseph Rodgers, male    DOB: November 02, 1950, 68 y.o.   MRN: [id number]  HPI  Patient calls with prostates symptoms that started last week but got a little worse over the weekend. Patient has had issues with prostate swelling in the past.   Virtual Visit via Video Note  I connected with Joseph Rodgers on 01/16/19 at 10:00 AM EST by a video enabled telemedicine application and verified that I am speaking with the correct person using two identifiers.  Location: Patient: home Provider: office   I discussed the limitations of evaluation and management by telemedicine and the availability of in person appointments. The patient expressed understanding and agreed to proceed.  History of Present Illness:    Observations/Objective:   Assessment and Plan:   Follow Up Instructions:    I discussed the assessment and treatment plan with the patient. The patient was provided an opportunity to ask questions and all were answered. The patient agreed with the plan and demonstrated an understanding of the instructions.   The patient was advised to call back or seek an in-person evaluation if the symptoms worsen or if the condition fails to improve as anticipated.  I provided 18 minutes of non-face-to-face time during this encounter.  Last week,  Had some discomfort after pulling in the weed eater  Then moted urin frequency  Some dysura  Less urine flow  Some low back discomfort   Has had recurrent prost infxns  Pt no fever or chills or nausea vom    Review of Systems No headache no chest pain no shortness of breath    Objective:   Physical Exam   Virtual     Assessment & Plan:  Impression probable prostate infection.  Discussed.  Antibiotics prescribed Cipro 500 twice daily 21 days warning signs discussed

## 2019-03-20 DIAGNOSIS — Z23 Encounter for immunization: Secondary | ICD-10-CM | POA: Diagnosis not present

## 2019-04-17 DIAGNOSIS — Z23 Encounter for immunization: Secondary | ICD-10-CM | POA: Diagnosis not present

## 2019-04-23 ENCOUNTER — Other Ambulatory Visit: Payer: Self-pay

## 2019-04-23 ENCOUNTER — Ambulatory Visit (INDEPENDENT_AMBULATORY_CARE_PROVIDER_SITE_OTHER): Payer: Medicare Other | Admitting: Family Medicine

## 2019-04-23 DIAGNOSIS — I10 Essential (primary) hypertension: Secondary | ICD-10-CM | POA: Diagnosis not present

## 2019-04-23 MED ORDER — LISINOPRIL 20 MG PO TABS: 20.0000 mg | ORAL_TABLET | Freq: Every day | ORAL | 1 refills | Status: DC

## 2019-04-23 MED ORDER — AMLODIPINE BESYLATE 2.5 MG PO TABS: 2.5000 mg | ORAL_TABLET | Freq: Every day | ORAL | 1 refills | Status: DC

## 2019-04-23 NOTE — Progress Notes (Signed)
   Subjective:    Patient ID: Joseph Rodgers, male    DOB: August 04, 1950, 69 y.o.   MRN: [id number]  Hypertension This is a chronic problem. The current episode started more than 1 year ago. Risk factors for coronary artery disease include male gender. Treatments tried: norvasc, lisinopril. There are no compliance problems.    No problems or concerns Patient denies any setbacks recently.  Taking his medicine watching diet he states he has gained weight because he has not been exercising as much  Review of Systems Virtual Visit via Video Note  I connected with Joseph Rodgers on 04/23/19 at 10:00 AM EST by a video enabled telemedicine application and verified that I am speaking with the correct person using two identifiers.  Location: Patient: home Provider: office   I discussed the limitations of evaluation and management by telemedicine and the availability of in person appointments. The patient expressed understanding and agreed to proceed.  History of Present Illness:    Observations/Objective:   Assessment and Plan:   Follow Up Instructions:    I discussed the assessment and treatment plan with the patient. The patient was provided an opportunity to ask questions and all were answered. The patient agreed with the plan and demonstrated an understanding of the instructions.   The patient was advised to call back or seek an in-person evaluation if the symptoms worsen or if the condition fails to improve as anticipated.  I provided 17 minutes of non-face-to-face time during this encounter.   Fall Risk  04/23/2019 04/24/2018 10/24/2017 09/14/2017  Falls in the past year? 0 0 No No  Comment - - - Emmi Telephone Survey: data to providers prior to load  Number falls in past yr: - 0 - -  Injury with Fall? - 0 - -  Follow up Falls evaluation completed Falls evaluation completed - -        Objective:   Physical Exam  Unable to do physical exam via phone      Assessment & Plan:   Blood pressure good control takes her medicine on a regular basis not having any issues  Hyperlipidemia does not tolerate statins patient will work hard on diet  Follow-up for wellness and chronic visits within 6 months

## 2019-06-07 ENCOUNTER — Other Ambulatory Visit: Payer: Self-pay | Admitting: Family Medicine

## 2019-07-17 ENCOUNTER — Encounter: Payer: Self-pay | Admitting: Nurse Practitioner

## 2019-07-17 ENCOUNTER — Other Ambulatory Visit: Payer: Self-pay

## 2019-07-17 ENCOUNTER — Ambulatory Visit (INDEPENDENT_AMBULATORY_CARE_PROVIDER_SITE_OTHER): Payer: Medicare Other | Admitting: Nurse Practitioner

## 2019-07-17 VITALS — BP 158/88 | Temp 98.3°F | Wt 211.0 lb

## 2019-07-17 DIAGNOSIS — R011 Cardiac murmur, unspecified: Secondary | ICD-10-CM

## 2019-07-17 DIAGNOSIS — R1032 Left lower quadrant pain: Secondary | ICD-10-CM | POA: Diagnosis not present

## 2019-07-17 DIAGNOSIS — K591 Functional diarrhea: Secondary | ICD-10-CM

## 2019-07-17 NOTE — Progress Notes (Signed)
   Subjective:    Patient ID: Joseph Rodgers, male    DOB: 02-03-1951, 69 y.o.   MRN: [id number]  HPI Patient comes in today with complaints of LLQ abdominal pain throughout the night. Pain is better this morning but still there. Patient states it was similar to a diverticulitis flare up which he has had in the past but rather than a sharp pain last night it was dull.   Review of Systems     Objective:   Physical Exam        Assessment & Plan:

## 2019-07-17 NOTE — Progress Notes (Signed)
New Patient Office Visit  Subjective:  Patient ID: Joseph Rodgers, male    DOB: September 01, 1950  Age: 69 y.o. MRN: [id number]  CC:  Left lower abdominal pain  HPI Joseph Rodgers presents for left lower abdominal pain Pt presents with onset last night LLQ abd throbbing pain that interfered with sleep.  Took Tylenol 1000mg  @ 1:30am, pain decreased but still interfered with sleep.  Woke up this morning and pain had improved.  Pt reports "it feels like diverticulitis".  Denies radiation, fever, back pain, and urinary complaints but does have to get up 2-3 times during night to urinate that has been ongoing.  Does report 1 week ago one episode of dysuria after taking a bath which resolved.   Last BM this morning and was normal.  Has normal stool every morning.  Pt reports onset several months ago started having watery stools occasionally after eating/drinking.  Pt thought it could have been due to tea so he stopped drinking tea.  Also, thought it could have been due to fried foods and raw onions.  Pt drank peppermint tea yesterday for "stomach".  Took tums last night after "eating 3 plain hot dogs", felt like he overate.  No pain or bleeding noted with stool.   Past Medical History:  Diagnosis Date  . Allergic rhinitis   . Coronary atherosclerosis of native coronary artery   . Diverticulitis    occasional  . Hyperlipidemia   . Hypertension    On meds, runs higher at MD office  . Kidney stones     Past Surgical History:  Procedure Laterality Date  . APPENDECTOMY    . colonoscopy  2005   Dr. Gala Romney: normal rectum, left-sided transverse diverticulum, normal TI  . COLONOSCOPY N/A 12/13/2017   Procedure: COLONOSCOPY;  Surgeon: Daneil Dolin, MD;  Location: [facility];  Service: Endoscopy;  Laterality: N/A;  2:15pm  . DOBUTAMINE STRESS ECHO  3/12   normal  . POLYPECTOMY  12/13/2017   Procedure: POLYPECTOMY;  Surgeon: Daneil Dolin, MD;  Location: [facility];  Service: Endoscopy;;   colon     Family History  Problem Relation Age of Onset  . Diabetes Mother   . Aneurysm Mother 96       Joseph Rodgers  . Colon cancer Neg Hx   . Colon polyps Neg Hx     Social History   Socioeconomic History  . Marital status: Divorced    Spouse name: Not on file  . Number of children: 0  . Years of education: Not on file  . Highest education level: Not on file  Occupational History  . Occupation: Furniture conservator/restorer  Tobacco Use  . Smoking status: Never Smoker  . Smokeless tobacco: Never Used  Substance and Sexual Activity  . Alcohol use: Not Currently    Comment: Occasional Beer, rare   . Drug use: No  . Sexual activity: Not on file  Other Topics Concern  . Not on file  Social History Narrative  . Not on file   Social Determinants of Health   Financial Resource Strain:   . Difficulty of Paying Living Expenses:   Food Insecurity:   . Worried About Charity fundraiser in the Last Year:   . Arboriculturist in the Last Year:   Transportation Needs:   . Film/video editor (Medical):   Marland Kitchen Lack of Transportation (Non-Medical):   Physical Activity:   . Days of Exercise per Week:   .  Minutes of Exercise per Session:   Stress:   . Feeling of Stress :   Social Connections:   . Frequency of Communication with Friends and Family:   . Frequency of Social Gatherings with Friends and Family:   . Attends Religious Services:   . Active Member of Clubs or Organizations:   . Attends Archivist Meetings:   Marland Kitchen Marital Status:   Intimate Partner Violence:   . Fear of Current or Ex-Partner:   . Emotionally Abused:   Marland Kitchen Physically Abused:   . Sexually Abused:     ROS Review of Systems  Constitutional: Negative.  Negative for fever.       Reports 20 lb weight gain during COVID  Respiratory: Negative for chest tightness and shortness of breath.   Cardiovascular: Negative for chest pain.  Gastrointestinal: Positive for abdominal pain and diarrhea. Negative for blood in stool,  constipation, nausea and vomiting.    Objective:   Today's Vitals: BP (!) 158/88   Temp 98.3 F (36.8 C)   Wt 95.7 kg   BMI 35.11 kg/m   Physical Exam Vitals reviewed.  Constitutional:      Appearance: Normal appearance.  Cardiovascular:     Rate and Rhythm: Normal rate.     Heart sounds: Murmur present. No gallop.      Comments: Systolic murmur II/IV heard loudest at 4th intercostal space at left mid clavicular line.    Pulmonary:     Effort: Pulmonary effort is normal.     Breath sounds: Normal breath sounds.  Abdominal:     General: Bowel sounds are normal. There is no distension.     Palpations: Abdomen is soft.     Tenderness: There is abdominal tenderness.     Comments: Moderate LLQ tenderness to deep palpation. Mild guarding. No rebound. No obvious mass.   Musculoskeletal:     Right lower leg: No edema.     Left lower leg: No edema.  Skin:    General: Skin is warm and dry.  Neurological:     Mental Status: He is alert.     Assessment & Plan:  Left lower quadrant abdominal pain  Systolic murmur  Functional diarrhea   Abdominal pain is much improved this am and no fever or bloody stools, so will hold on antibiotics for potential diverticulitis. Patient to call back in 48 hours if not resolved, sooner if worse. Defers GI referral for loose stools after eating. A review of the chart after the visit showed no previous notation of a cardiac murmur.  His last cardiac work-up was in 2012.  Refer back to cardiology for evaluation. Warning signs reviewed regarding his abdominal pain and heart murmur.  Seek help immediately if any urgent problems.  30 minutes was spent with the patient including previsit chart review, time spent with patient, discussion of health issues, review of data including medical record, and documentation of the visit.    Follow-up: Return if symptoms worsen or fail to improve.    Alma Downs, RN

## 2019-07-18 ENCOUNTER — Other Ambulatory Visit: Payer: Self-pay | Admitting: *Deleted

## 2019-07-18 DIAGNOSIS — R011 Cardiac murmur, unspecified: Secondary | ICD-10-CM

## 2019-07-24 ENCOUNTER — Encounter: Payer: Self-pay | Admitting: Family Medicine

## 2019-08-01 ENCOUNTER — Ambulatory Visit (INDEPENDENT_AMBULATORY_CARE_PROVIDER_SITE_OTHER): Payer: Medicare Other

## 2019-08-01 ENCOUNTER — Ambulatory Visit (INDEPENDENT_AMBULATORY_CARE_PROVIDER_SITE_OTHER): Payer: Medicare Other | Admitting: Cardiology

## 2019-08-01 ENCOUNTER — Encounter: Payer: Self-pay | Admitting: Cardiology

## 2019-08-01 VITALS — BP 160/84 | HR 102 | Ht 66.5 in | Wt 210.6 lb

## 2019-08-01 DIAGNOSIS — I10 Essential (primary) hypertension: Secondary | ICD-10-CM

## 2019-08-01 DIAGNOSIS — R011 Cardiac murmur, unspecified: Secondary | ICD-10-CM

## 2019-08-01 LAB — ECHOCARDIOGRAM COMPLETE
Height: 66.5 in
Weight: 3369.6 oz

## 2019-08-01 NOTE — Patient Instructions (Signed)
Your physician wants you to follow-up in: PENDING WITH DR [facility]  Your physician recommends that you continue on your current medications as directed. Please refer to the Current Medication list given to you today.  Your physician has requested that you have an echocardiogram. Echocardiography is a painless test that uses sound waves to create images of your heart. It provides your doctor with information about the size and shape of your heart and how well your heart's chambers and valves are working. This procedure takes approximately one hour. There are no restrictions for this procedure.  Thank you for choosing [facility]!!

## 2019-08-01 NOTE — Progress Notes (Signed)
Clinical Summary Joseph Rodgers is a 69 y.o.male seen as new consult, referred by Dr Wolfgang Phoenix for heart murmur   1. Heart murmur - noted by pcp recently - no recent SOB/DOE. No chest pains. No edema    2. Coronary calcifications - noted on prior CT scan in 2012, at the time a stress echo was benign.   3. HTN - home bp's 130s/70s. He has a component white coat HTN    Past Medical History:  Diagnosis Date  . Allergic rhinitis   . Coronary atherosclerosis of native coronary artery   . Diverticulitis    occasional  . Hyperlipidemia   . Hypertension    On meds, runs higher at MD office  . Kidney stones      Allergies  Allergen Reactions  . Psyllium Hives  . Red Yeast Rice [Cholestin]     myalgias  . Sulfa Drugs Cross Reactors     Swelling in groin area  . Simvastatin [Fd&C Red #40 [place]-Simvastatin]     myalgia     Current Outpatient Medications  Medication Sig Dispense Refill  . acetaminophen (TYLENOL) 500 MG tablet Take 500 mg by mouth every 6 (six) hours as needed (for pain.).     Marland Kitchen amLODipine (NORVASC) 2.5 MG tablet Take 1 tablet (2.5 mg total) by mouth daily. 90 tablet 1  . lisinopril (ZESTRIL) 20 MG tablet Take 1 tablet (20 mg total) by mouth daily. 90 tablet 1  . naproxen sodium (ALEVE) 220 MG tablet Take 220 mg by mouth 2 (two) times daily as needed (for pain.).     Marland Kitchen Polyethyl Glycol-Propyl Glycol (LUBRICANT EYE DROPS) 0.4-0.3 % SOLN Place 1 drop into both eyes 3 (three) times daily as needed (for dry eyes.).     No current facility-administered medications for this visit.     Past Surgical History:  Procedure Laterality Date  . APPENDECTOMY    . colonoscopy  2005   Dr. Gala Romney: normal rectum, left-sided transverse diverticulum, normal TI  . COLONOSCOPY N/A 12/13/2017   Procedure: COLONOSCOPY;  Surgeon: Daneil Dolin, MD;  Location: [facility];  Service: Endoscopy;  Laterality: N/A;  2:15pm  . DOBUTAMINE STRESS ECHO  3/12   normal  .  POLYPECTOMY  12/13/2017   Procedure: POLYPECTOMY;  Surgeon: Daneil Dolin, MD;  Location: [facility];  Service: Endoscopy;;  colon      Allergies  Allergen Reactions  . Psyllium Hives  . Red Yeast Rice [Cholestin]     myalgias  . Sulfa Drugs Cross Reactors     Swelling in groin area  . Simvastatin [Fd&C Red #40 [place]-Simvastatin]     myalgia      Family History  Problem Relation Age of Onset  . Diabetes Mother   . Aneurysm Mother 42       Joseph Rodgers  . Colon cancer Neg Hx   . Colon polyps Neg Hx      Social History Joseph Rodgers reports that he has never smoked. He has never used smokeless tobacco. Joseph Rodgers reports previous alcohol use.   Review of Systems CONSTITUTIONAL: No weight loss, fever, chills, weakness or fatigue.  HEENT: Eyes: No visual loss, blurred vision, double vision or yellow sclerae.No hearing loss, sneezing, congestion, runny nose or sore throat.  SKIN: No rash or itching.  CARDIOVASCULAR: per hpi RESPIRATORY: No shortness of breath, cough or sputum.  GASTROINTESTINAL: No anorexia, nausea, vomiting or diarrhea. No abdominal pain or blood.  GENITOURINARY:  No burning on urination, no polyuria NEUROLOGICAL: No headache, dizziness, syncope, paralysis, ataxia, numbness or tingling in the extremities. No change in bowel or bladder control.  MUSCULOSKELETAL: No muscle, back pain, joint pain or stiffness.  LYMPHATICS: No enlarged nodes. No history of splenectomy.  PSYCHIATRIC: No history of depression or anxiety.  ENDOCRINOLOGIC: No reports of sweating, cold or heat intolerance. No polyuria or polydipsia.  Marland Kitchen   Physical Examination Today's Vitals   08/01/19 0852  BP: (!) 160/84  Pulse: (!) 102  SpO2: 98%  Weight: 210 lb 9.6 oz (95.5 kg)  Height: 5' 6.5" (1.689 m)   Body mass index is 33.48 kg/m.  Gen: resting comfortably, no acute distress HEENT: no scleral icterus, pupils equal round and reactive, no palptable cervical adenopathy,  CV: RRR, 3/6  systolic murmur rusb, no jvd Resp: Clear to auscultation bilaterally GI: abdomen is soft, non-tender, non-distended, normal bowel sounds, no hepatosplenomegaly MSK: extremities are warm, no edema.  Skin: warm, no rash Neuro:  no focal deficits Psych: appropriate affect     Assessment and Plan  1. Heart murmur - no significant symptoms - we will obtain an echo to better define the etiology   2. HTN - elevated in clinic, has history of white coat HTN. Home bp's he has with him are fine.   F/u pending echo results.       Arnoldo Lenis, M.D.,

## 2019-08-06 ENCOUNTER — Telehealth: Payer: Self-pay | Admitting: *Deleted

## 2019-08-06 NOTE — Telephone Encounter (Signed)
-----   Message from Arnoldo Lenis, MD sent at 08/06/2019 10:31 AM EDT ----- Normal echo. His heart valves are normal. His heart murmur is due to the fact that his heart pumping function is very strong and moves the blood through the valves quickly which creates a benign murmur. No other testing is indicated, can f/u just as needed.   Zandra Abts MD

## 2019-08-06 NOTE — Telephone Encounter (Signed)
Pt voiced understanding - routed to pcp  

## 2019-10-08 ENCOUNTER — Telehealth: Payer: Self-pay | Admitting: Family Medicine

## 2019-10-08 DIAGNOSIS — I10 Essential (primary) hypertension: Secondary | ICD-10-CM

## 2019-10-08 DIAGNOSIS — E7849 Other hyperlipidemia: Secondary | ICD-10-CM

## 2019-10-08 DIAGNOSIS — Z1211 Encounter for screening for malignant neoplasm of colon: Secondary | ICD-10-CM

## 2019-10-08 DIAGNOSIS — Z125 Encounter for screening for malignant neoplasm of prostate: Secondary | ICD-10-CM

## 2019-10-08 DIAGNOSIS — Z79899 Other long term (current) drug therapy: Secondary | ICD-10-CM

## 2019-10-08 NOTE — Telephone Encounter (Signed)
Pt as appt on 9/10 and would like lab work done.

## 2019-10-15 DIAGNOSIS — I10 Essential (primary) hypertension: Secondary | ICD-10-CM | POA: Diagnosis not present

## 2019-10-15 DIAGNOSIS — Z125 Encounter for screening for malignant neoplasm of prostate: Secondary | ICD-10-CM | POA: Diagnosis not present

## 2019-10-15 DIAGNOSIS — Z79899 Other long term (current) drug therapy: Secondary | ICD-10-CM | POA: Diagnosis not present

## 2019-10-15 DIAGNOSIS — E7849 Other hyperlipidemia: Secondary | ICD-10-CM | POA: Diagnosis not present

## 2019-10-16 LAB — LIPID PANEL
Chol/HDL Ratio: 5.9 ratio — ABNORMAL HIGH (ref 0.0–5.0)
Cholesterol, Total: 248 mg/dL — ABNORMAL HIGH (ref 100–199)
HDL: 42 mg/dL (ref >39)
LDL Chol Calc (NIH): 169 mg/dL — ABNORMAL HIGH (ref 0–99)
Triglycerides: 199 mg/dL — ABNORMAL HIGH (ref 0–149)
VLDL Cholesterol Cal: 37 mg/dL (ref 5–40)

## 2019-10-16 LAB — BASIC METABOLIC PANEL
BUN/Creatinine Ratio: 16 (ref 10–24)
BUN: 20 mg/dL (ref 8–27)
CO2: 25 mmol/L (ref 20–29)
Calcium: 10.1 mg/dL (ref 8.6–10.2)
Chloride: 99 mmol/L (ref 96–106)
Creatinine, Ser: 1.28 mg/dL — ABNORMAL HIGH (ref 0.76–1.27)
GFR calc Af Amer: 66 mL/min/{1.73_m2} (ref >59)
GFR calc non Af Amer: 57 mL/min/{1.73_m2} — ABNORMAL LOW (ref >59)
Glucose: 104 mg/dL — ABNORMAL HIGH (ref 65–99)
Potassium: 5.1 mmol/L (ref 3.5–5.2)
Sodium: 138 mmol/L (ref 134–144)

## 2019-10-16 LAB — HEPATIC FUNCTION PANEL
ALT: 29 IU/L (ref 0–44)
AST: 31 IU/L (ref 0–40)
Albumin: 4.9 g/dL — ABNORMAL HIGH (ref 3.8–4.8)
Alkaline Phosphatase: 70 IU/L (ref 48–121)
Bilirubin Total: 0.8 mg/dL (ref 0.0–1.2)
Bilirubin, Direct: 0.19 mg/dL (ref 0.00–0.40)
Total Protein: 7.7 g/dL (ref 6.0–8.5)

## 2019-10-16 LAB — PSA: Prostate Specific Ag, Serum: 1.9 ng/mL (ref 0.0–4.0)

## 2019-10-25 ENCOUNTER — Other Ambulatory Visit: Payer: Self-pay

## 2019-10-25 ENCOUNTER — Encounter: Payer: Self-pay | Admitting: Family Medicine

## 2019-10-25 ENCOUNTER — Ambulatory Visit (INDEPENDENT_AMBULATORY_CARE_PROVIDER_SITE_OTHER): Payer: Medicare Other | Admitting: Family Medicine

## 2019-10-25 VITALS — BP 138/86 | Temp 97.6°F | Ht 66.5 in | Wt 209.8 lb

## 2019-10-25 DIAGNOSIS — I10 Essential (primary) hypertension: Secondary | ICD-10-CM

## 2019-10-25 DIAGNOSIS — Z0000 Encounter for general adult medical examination without abnormal findings: Secondary | ICD-10-CM

## 2019-10-25 DIAGNOSIS — N289 Disorder of kidney and ureter, unspecified: Secondary | ICD-10-CM

## 2019-10-25 DIAGNOSIS — E7849 Other hyperlipidemia: Secondary | ICD-10-CM | POA: Diagnosis not present

## 2019-10-25 DIAGNOSIS — H6123 Impacted cerumen, bilateral: Secondary | ICD-10-CM | POA: Diagnosis not present

## 2019-10-25 DIAGNOSIS — Z23 Encounter for immunization: Secondary | ICD-10-CM | POA: Diagnosis not present

## 2019-10-25 MED ORDER — LISINOPRIL 20 MG PO TABS
20.0000 mg | ORAL_TABLET | Freq: Every day | ORAL | 1 refills | Status: DC
Start: 2019-10-25 — End: 2020-04-15

## 2019-10-25 MED ORDER — AMLODIPINE BESYLATE 2.5 MG PO TABS
2.5000 mg | ORAL_TABLET | Freq: Every day | ORAL | 1 refills | Status: DC
Start: 2019-10-25 — End: 2020-04-15

## 2019-10-25 NOTE — Patient Instructions (Signed)
Patient will do flu vaccine at the pharmacy Patient will work hard at keeping blood pressure under control minimizing proteins in the diet Repeat lab work and urine micro protein as planned Shingles vaccine hold on this for right now Follow-up 6 months

## 2019-10-25 NOTE — Progress Notes (Addendum)
   Subjective:    Patient ID: Joseph Rodgers, male    DOB: 03/24/1950, 69 y.o.   MRN: [id number]  HPI  The patient comes in today for a wellness visit.    A review of their health history was completed.  A review of medications was also completed.  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   Additional concerns: some vertigo off and on since ears stopped up Also noticed some light swelling in feet occasionally and wonders if it is related to Norvasc    Review of Systems  Constitutional: Negative for activity change, appetite change and fatigue.  HENT: Negative for congestion and rhinorrhea.   Respiratory: Negative for cough and shortness of breath.   Cardiovascular: Negative for chest pain and leg swelling.  Gastrointestinal: Negative for abdominal pain, nausea and vomiting.  Neurological: Negative for dizziness and headaches.  Psychiatric/Behavioral: Negative for agitation and behavioral problems.       Objective:   Physical Exam Vitals reviewed.  Constitutional:      General: He is not in acute distress. HENT:     Head: Normocephalic and atraumatic.  Eyes:     General:        Right eye: No discharge.        Left eye: No discharge.  Neck:     Trachea: No tracheal deviation.  Cardiovascular:     Rate and Rhythm: Normal rate and regular rhythm.     Heart sounds: Normal heart sounds. No murmur heard.   Pulmonary:     Effort: Pulmonary effort is normal. No respiratory distress.     Breath sounds: Normal breath sounds.  Lymphadenopathy:     Cervical: No cervical adenopathy.  Skin:    General: Skin is warm and dry.  Neurological:     Mental Status: He is alert.     Coordination: Coordination normal.  Psychiatric:         Behavior: Behavior normal.    Bilateral cerumen impaction  Prostate exam normal      Assessment & Plan:  1. Bilateral impacted cerumen Cerumen impaction recommend ENT referral waxes close to the eardrum bilateral  2. Well adult exam Adult wellness-complete.wellness physical was conducted today. Importance of diet and exercise were discussed in detail.  In addition to this a discussion regarding safety was also covered. We also reviewed over immunizations and gave recommendations regarding current immunization needed for age.  In addition to this additional areas were also touched on including: Preventative health exams needed:  Colonoscopy 2024  Patient was advised yearly wellness exam   3. Renal insufficiency Related to his blood pressure very important to avoid excessive protein we will check kidney function when the patient is well-hydrated plus also urine micro protein to make sure patient is not spilling protein - Basic metabolic panel - Microalbumin / creatinine urine ratio  4. Other hyperlipidemia Hyperlipidemia does not tolerate statins recommend healthy diet further down the road when medications are more open for approval injectable medicines for cholesterol would be a good step  5. Essential hypertension Blood pressure walking recommended watching diet staying physically active

## 2019-10-28 NOTE — Addendum Note (Signed)
Addended by: Dairl Ponder on: 10/28/2019 08:20 AM   Modules accepted: Orders

## 2019-10-29 DIAGNOSIS — H43813 Vitreous degeneration, bilateral: Secondary | ICD-10-CM | POA: Diagnosis not present

## 2019-11-05 ENCOUNTER — Encounter: Payer: Self-pay | Admitting: Family Medicine

## 2019-11-15 DIAGNOSIS — N289 Disorder of kidney and ureter, unspecified: Secondary | ICD-10-CM | POA: Diagnosis not present

## 2019-11-16 LAB — MICROALBUMIN / CREATININE URINE RATIO
Creatinine, Urine: 35.1 mg/dL
Microalb/Creat Ratio: 15 mg/g creat (ref 0–29)
Microalbumin, Urine: 5.2 ug/mL

## 2019-11-16 LAB — BASIC METABOLIC PANEL
BUN/Creatinine Ratio: 14 (ref 10–24)
BUN: 17 mg/dL (ref 8–27)
CO2: 26 mmol/L (ref 20–29)
Calcium: 9.9 mg/dL (ref 8.6–10.2)
Chloride: 99 mmol/L (ref 96–106)
Creatinine, Ser: 1.24 mg/dL (ref 0.76–1.27)
GFR calc Af Amer: 68 mL/min/{1.73_m2} (ref >59)
GFR calc non Af Amer: 59 mL/min/{1.73_m2} — ABNORMAL LOW (ref >59)
Glucose: 122 mg/dL — ABNORMAL HIGH (ref 65–99)
Potassium: 4.8 mmol/L (ref 3.5–5.2)
Sodium: 139 mmol/L (ref 134–144)

## 2019-11-18 ENCOUNTER — Other Ambulatory Visit: Payer: Self-pay | Admitting: *Deleted

## 2019-11-18 DIAGNOSIS — I10 Essential (primary) hypertension: Secondary | ICD-10-CM

## 2019-11-18 DIAGNOSIS — N289 Disorder of kidney and ureter, unspecified: Secondary | ICD-10-CM

## 2019-11-19 DIAGNOSIS — Z974 Presence of external hearing-aid: Secondary | ICD-10-CM | POA: Diagnosis not present

## 2019-11-19 DIAGNOSIS — H903 Sensorineural hearing loss, bilateral: Secondary | ICD-10-CM | POA: Diagnosis not present

## 2019-11-19 DIAGNOSIS — H6123 Impacted cerumen, bilateral: Secondary | ICD-10-CM | POA: Diagnosis not present

## 2019-12-31 DIAGNOSIS — Z23 Encounter for immunization: Secondary | ICD-10-CM | POA: Diagnosis not present

## 2020-01-21 ENCOUNTER — Other Ambulatory Visit: Payer: Self-pay | Admitting: *Deleted

## 2020-01-21 ENCOUNTER — Telehealth: Payer: Self-pay | Admitting: *Deleted

## 2020-01-21 ENCOUNTER — Other Ambulatory Visit: Payer: Self-pay

## 2020-01-21 ENCOUNTER — Telehealth: Payer: Self-pay | Admitting: Family Medicine

## 2020-01-21 ENCOUNTER — Telehealth (INDEPENDENT_AMBULATORY_CARE_PROVIDER_SITE_OTHER): Payer: Medicare Other | Admitting: Family Medicine

## 2020-01-21 DIAGNOSIS — N410 Acute prostatitis: Secondary | ICD-10-CM | POA: Diagnosis not present

## 2020-01-21 MED ORDER — CIPROFLOXACIN HCL 500 MG PO TABS
500.0000 mg | ORAL_TABLET | Freq: Two times a day (BID) | ORAL | 0 refills | Status: DC
Start: 2020-01-21 — End: 2020-04-15

## 2020-01-21 MED ORDER — CIPROFLOXACIN HCL 500 MG PO TABS: 500.0000 mg | ORAL_TABLET | Freq: Two times a day (BID) | ORAL | 0 refills | Status: DC

## 2020-01-21 MED ORDER — CIPROFLOXACIN HCL 500 MG PO TABS
500.0000 mg | ORAL_TABLET | Freq: Two times a day (BID) | ORAL | 0 refills | Status: DC
Start: 2020-01-21 — End: 2020-01-21

## 2020-01-21 NOTE — Progress Notes (Signed)
   Subjective:    Patient ID: Joseph Rodgers, male    DOB: 1950-06-01, 69 y.o.   MRN: [id number] Very nice patient Phone conversation Video not possible Patient denies high fever chills sweats HPI Pt has been experiencing squeezing pain, decreased flow, painful urination. Also urine has began to have a smell and pt has had a hard time using bathroom. Has taken Tylenol to help.   Virtual Visit via Telephone Note  I connected with Joseph Rodgers on 01/21/20 at  9:00 AM EST by telephone and verified that I am speaking with the correct person using two identifiers.  Location: Patient: home Provider: office   I discussed the limitations, risks, security and privacy concerns of performing an evaluation and management service by telephone and the availability of in person appointments. I also discussed with the patient that there may be a patient responsible charge related to this service. The patient expressed understanding and agreed to proceed.   History of Present Illness:    Observations/Objective:   Assessment and Plan:   Follow Up Instructions:    I discussed the assessment and treatment plan with the patient. The patient was provided an opportunity to ask questions and all were answered. The patient agreed with the plan and demonstrated an understanding of the instructions.   The patient was advised to call back or seek an in-person evaluation if the symptoms worsen or if the condition fails to improve as anticipated.  I provided 20 minutes of non-face-to-face time during this encounter.      Review of Systems Dysuria pelvic pain pressure feeling denies hematuria denies fever chills sweats    Objective:   Physical Exam   Today's visit was via telephone Physical exam was not possible for this visit      Assessment & Plan:  Prostatitis go ahead with antibiotic for 3 weeks warning signs discussed follow-up if progressive troubles or worse if having ongoing  troubles will need a follow-up visit here

## 2020-01-21 NOTE — Telephone Encounter (Signed)
I also called pt because he called to check on this and he said to send it to [facility] and he will let us know if their is a problem with it there. I sent to [facility].

## 2020-01-21 NOTE — Telephone Encounter (Signed)
thanks

## 2020-01-21 NOTE — Telephone Encounter (Signed)
[facility] called and states they are out of cipro that was sent in today. On back order and not sure when they will get more in.

## 2020-01-21 NOTE — Telephone Encounter (Signed)
Mr. joahan, swatzell are scheduled for a virtual visit with your provider today.    Just as we do with appointments in the office, we must obtain your consent to participate.  Your consent will be active for this visit and any virtual visit you may have with one of our providers in the next 365 days.    If you have a MyChart account, I can also send a copy of this consent to you electronically.  All virtual visits are billed to your insurance company just like a traditional visit in the office.  As this is a virtual visit, video technology does not allow for your provider to perform a traditional examination.  This may limit your provider's ability to fully assess your condition.  If your provider identifies any concerns that need to be evaluated in person or the need to arrange testing such as labs, EKG, etc, we will make arrangements to do so.    Although advances in technology are sophisticated, we cannot ensure that it will always work on either your end or our end.  If the connection with a video visit is poor, we may have to switch to a telephone visit.  With either a video or telephone visit, we are not always able to ensure that we have a secure connection.   I need to obtain your verbal consent now.   Are you willing to proceed with your visit today?   KAZUTO SEVEY has provided verbal consent on 01/21/2020 for a virtual visit (video or telephone).   Vicente Males, LPN 49/08/261  7:85 AM

## 2020-04-07 DIAGNOSIS — I10 Essential (primary) hypertension: Secondary | ICD-10-CM | POA: Diagnosis not present

## 2020-04-07 DIAGNOSIS — N289 Disorder of kidney and ureter, unspecified: Secondary | ICD-10-CM | POA: Diagnosis not present

## 2020-04-08 LAB — BASIC METABOLIC PANEL
BUN/Creatinine Ratio: 22 (ref 10–24)
BUN: 21 mg/dL (ref 8–27)
CO2: 21 mmol/L (ref 20–29)
Calcium: 9.6 mg/dL (ref 8.6–10.2)
Chloride: 98 mmol/L (ref 96–106)
Creatinine, Ser: 0.96 mg/dL (ref 0.76–1.27)
GFR calc Af Amer: 92 mL/min/{1.73_m2} (ref >59)
GFR calc non Af Amer: 80 mL/min/{1.73_m2} (ref >59)
Glucose: 120 mg/dL — ABNORMAL HIGH (ref 65–99)
Potassium: 4.7 mmol/L (ref 3.5–5.2)
Sodium: 137 mmol/L (ref 134–144)

## 2020-04-15 ENCOUNTER — Telehealth: Payer: Self-pay | Admitting: Family Medicine

## 2020-04-15 ENCOUNTER — Other Ambulatory Visit: Payer: Self-pay

## 2020-04-15 ENCOUNTER — Telehealth (INDEPENDENT_AMBULATORY_CARE_PROVIDER_SITE_OTHER): Payer: Medicare Other | Admitting: Family Medicine

## 2020-04-15 DIAGNOSIS — I10 Essential (primary) hypertension: Secondary | ICD-10-CM | POA: Diagnosis not present

## 2020-04-15 MED ORDER — LISINOPRIL 20 MG PO TABS: 20.0000 mg | ORAL_TABLET | Freq: Every day | ORAL | 1 refills | Status: DC

## 2020-04-15 MED ORDER — MOMETASONE FUROATE 0.1 % EX CREA
TOPICAL_CREAM | CUTANEOUS | 12 refills | Status: DC
Start: 2020-04-15 — End: 2023-05-16

## 2020-04-15 MED ORDER — AMLODIPINE BESYLATE 2.5 MG PO TABS
2.5000 mg | ORAL_TABLET | Freq: Every day | ORAL | 1 refills | Status: DC
Start: 2020-04-15 — End: 2020-10-26

## 2020-04-15 NOTE — Progress Notes (Signed)
   Subjective:    Patient ID: Joseph Rodgers, male    DOB: Jun 01, 1950, 70 y.o.   MRN: [id number]  HPI Pt here for follow up. Pt is concerned about recent blood work. Needing medication refill on blood pressure medications.   Pt also would like a refill on Elocon cream. Pt states he gets a rash on his lower leg in winter time that itches and pt states provider had given him cream a few years ago.   Also, would like to know if he can make an appt to have a skin tag removed on his back.   Virtual Visit via Telephone Note  I connected with Joseph Rodgers on 04/15/20 at  9:00 AM EST by telephone and verified that I am speaking with the correct person using two identifiers.  Location: Patient: home Provider: office   I discussed the limitations, risks, security and privacy concerns of performing an evaluation and management service by telephone and the availability of in person appointments. I also discussed with the patient that there may be a patient responsible charge related to this service. The patient expressed understanding and agreed to proceed.   History of Present Illness:    Observations/Objective:   Assessment and Plan:   Follow Up Instructions:    I discussed the assessment and treatment plan with the patient. The patient was provided an opportunity to ask questions and all were answered. The patient agreed with the plan and demonstrated an understanding of the instructions.   The patient was advised to call back or seek an in-person evaluation if the symptoms worsen or if the condition fails to improve as anticipated.  I provided 20 minutes of non-face-to-face time during this encounter.       Review of Systems     Objective:   Physical Exam  Today's visit was via telephone Physical exam was not possible for this visit       Assessment & Plan:  Blood pressure under good control Continue medications Lab work overall looks good Patient will follow-up  later in March or early April to have skin tag removed.

## 2020-04-15 NOTE — Telephone Encounter (Signed)
[name], freas are scheduled for a virtual visit with your provider today.    Just as we do with appointments in the office, we must obtain your consent to participate.  Your consent will be active for this visit and any virtual visit you may have with one of our providers in the next 365 days.    If you have a MyChart account, I can also send a copy of this consent to you electronically.  All virtual visits are billed to your insurance company just like a traditional visit in the office.  As this is a virtual visit, video technology does not allow for your provider to perform a traditional examination.  This may limit your provider's ability to fully assess your condition.  If your provider identifies any concerns that need to be evaluated in person or the need to arrange testing such as labs, EKG, etc, we will make arrangements to do so.    Although advances in technology are sophisticated, we cannot ensure that it will always work on either your end or our end.  If the connection with a video visit is poor, we may have to switch to a telephone visit.  With either a video or telephone visit, we are not always able to ensure that we have a secure connection.   I need to obtain your verbal consent now.   Are you willing to proceed with your visit today?   Joseph Rodgers has provided verbal consent on 04/15/2020 for a virtual visit (video or telephone).   Vicente Males, LPN 09/19/7617  5:09 AM

## 2020-04-26 ENCOUNTER — Encounter: Payer: Self-pay | Admitting: Family Medicine

## 2020-04-26 DIAGNOSIS — I700 Atherosclerosis of aorta: Secondary | ICD-10-CM

## 2020-04-26 HISTORY — DX: Atherosclerosis of aorta: I70.0

## 2020-05-18 ENCOUNTER — Ambulatory Visit (INDEPENDENT_AMBULATORY_CARE_PROVIDER_SITE_OTHER): Payer: Medicare Other | Admitting: Family Medicine

## 2020-05-18 ENCOUNTER — Encounter: Payer: Self-pay | Admitting: Family Medicine

## 2020-05-18 ENCOUNTER — Other Ambulatory Visit: Payer: Self-pay

## 2020-05-18 VITALS — BP 160/90 | Temp 99.5°F | Wt 213.2 lb

## 2020-05-18 DIAGNOSIS — L918 Other hypertrophic disorders of the skin: Secondary | ICD-10-CM

## 2020-05-18 NOTE — Progress Notes (Signed)
   Subjective:    Patient ID: Joseph Rodgers, male    DOB: 1950-04-16, 70 y.o.   MRN: [id number]  HPI Pt here to have skin tag removed. Skin tag on middle part of back. Pt states sometimes the skin tag bothers him when sitting in car.  Denies any bleeding pain or discomfort  Review of Systems    Please see above Objective:   Physical Exam  Patient has multiple lesions on his skin of seborrheic keratosis.  Also has multiple skin tags 1 in the middle of his back is bothering him sometimes gets caught on his clothing fairly large size 2 small skin tags near after discussion with patient he would like to have all of these taken care of      Assessment & Plan:  Skin tags  The middle of the back is fairly large 2 smaller ones near it After discussion with the patient patient decides he would like to have these removed 1% lidocaine with epinephrine was injected Skin tags removed without difficulty Silver nitrate sticks used to help with cautery Band-Aids placed Remove them tomorrow clean daily soap and water Call us if any ongoing troubles No sign of any cancer with these these are purely skin tags and were discarded

## 2020-07-23 ENCOUNTER — Ambulatory Visit: Payer: Medicare Other | Admitting: Nurse Practitioner

## 2020-09-11 ENCOUNTER — Telehealth: Payer: Self-pay | Admitting: Family Medicine

## 2020-09-11 DIAGNOSIS — Z125 Encounter for screening for malignant neoplasm of prostate: Secondary | ICD-10-CM

## 2020-09-11 DIAGNOSIS — Z0000 Encounter for general adult medical examination without abnormal findings: Secondary | ICD-10-CM

## 2020-09-11 DIAGNOSIS — E7849 Other hyperlipidemia: Secondary | ICD-10-CM

## 2020-09-11 DIAGNOSIS — I10 Essential (primary) hypertension: Secondary | ICD-10-CM

## 2020-09-11 NOTE — Telephone Encounter (Signed)
Last labs 10/15/19: Lipid, Liver, Bmet and PSA                  03/2020: Bmet

## 2020-09-11 NOTE — Telephone Encounter (Signed)
Patient has physical on 9/12 and needing labs done and should he eat before taking labs.

## 2020-09-14 NOTE — Telephone Encounter (Signed)
Metabolic 7, lipid, liver, PSA Hypertension, hyperlipidemia, screening PSA

## 2020-09-14 NOTE — Telephone Encounter (Signed)
Pt contacted and verbalized understanding. Pt advised nothing to eat at least 8 hours before but my have water. Pt verbalized understanding. Lab orders placed.

## 2020-09-28 ENCOUNTER — Telehealth: Payer: Self-pay | Admitting: *Deleted

## 2020-09-28 NOTE — Chronic Care Management (AMB) (Signed)
  Chronic Care Management   Note  09/28/2020 Name: Joseph Rodgers MRN: [id number] DOB: June 01, 1950  [name] is a 70 y.o. year old male who is a primary care patient of Luking, Elayne Snare, MD. I reached out to [name] by phone today in response to a referral sent by Mr. Louise Victory Givans's PCP, Dr. Wolfgang Phoenix.      Mr. Gosnell was given information about Chronic Care Management services today including:  CCM service includes personalized support from designated clinical staff supervised by his physician, including individualized plan of care and coordination with other care providers 24/7 contact phone numbers for assistance for urgent and routine care needs. Service will only be billed when office clinical staff spend 20 minutes or more in a month to coordinate care. Only one practitioner may furnish and bill the service in a calendar month. The patient may stop CCM services at any time (effective at the end of the month) by phone call to the office staff. The patient will be responsible for cost sharing (co-pay) of up to 20% of the service fee (after annual deductible is met).  Patient agreed to services and verbal consent obtained.   Follow up plan: Telephone appointment with care management team member scheduled for:10/07/20  Tyrion Glaude  Care Guide, Embedded Care Coordination [place]  Care Management  Direct Dial: [phone]

## 2020-10-07 ENCOUNTER — Ambulatory Visit: Payer: Medicare Other | Admitting: *Deleted

## 2020-10-07 NOTE — Chronic Care Management (AMB) (Signed)
   10/07/2020  Joseph Rodgers 12-28-1950 [id number]   Initial telephone outreach to patient, spoke with patient who reports he would rather wait and talk with his primary doctor at his next visit about this program.  Patient does not feel he has any needs for this program at present and has questions about why medicare is billed.  RN care manager answered questions and patient states if he changes his mind in the future he will have primary care provider send a referral.  Case closed  Jacqlyn Larsen [facility], BSN RN Case Manager [place] [phone]

## 2020-10-20 DIAGNOSIS — I10 Essential (primary) hypertension: Secondary | ICD-10-CM | POA: Diagnosis not present

## 2020-10-20 DIAGNOSIS — Z0000 Encounter for general adult medical examination without abnormal findings: Secondary | ICD-10-CM | POA: Diagnosis not present

## 2020-10-20 DIAGNOSIS — E7849 Other hyperlipidemia: Secondary | ICD-10-CM | POA: Diagnosis not present

## 2020-10-21 LAB — BASIC METABOLIC PANEL
BUN/Creatinine Ratio: 14 (ref 10–24)
BUN: 16 mg/dL (ref 8–27)
CO2: 21 mmol/L (ref 20–29)
Calcium: 9.8 mg/dL (ref 8.6–10.2)
Chloride: 97 mmol/L (ref 96–106)
Creatinine, Ser: 1.16 mg/dL (ref 0.76–1.27)
Glucose: 105 mg/dL — ABNORMAL HIGH (ref 65–99)
Potassium: 4.1 mmol/L (ref 3.5–5.2)
Sodium: 136 mmol/L (ref 134–144)
eGFR: 68 mL/min/{1.73_m2} (ref >59)

## 2020-10-21 LAB — HEPATIC FUNCTION PANEL
ALT: 30 IU/L (ref 0–44)
AST: 33 IU/L (ref 0–40)
Albumin: 5.1 g/dL — ABNORMAL HIGH (ref 3.8–4.8)
Alkaline Phosphatase: 73 IU/L (ref 44–121)
Bilirubin Total: 0.6 mg/dL (ref 0.0–1.2)
Bilirubin, Direct: 0.12 mg/dL (ref 0.00–0.40)
Total Protein: 7.4 g/dL (ref 6.0–8.5)

## 2020-10-21 LAB — LIPID PANEL
Chol/HDL Ratio: 6.7 ratio — ABNORMAL HIGH (ref 0.0–5.0)
Cholesterol, Total: 261 mg/dL — ABNORMAL HIGH (ref 100–199)
HDL: 39 mg/dL — ABNORMAL LOW (ref >39)
LDL Chol Calc (NIH): 158 mg/dL — ABNORMAL HIGH (ref 0–99)
Triglycerides: 339 mg/dL — ABNORMAL HIGH (ref 0–149)
VLDL Cholesterol Cal: 64 mg/dL — ABNORMAL HIGH (ref 5–40)

## 2020-10-21 LAB — PSA: Prostate Specific Ag, Serum: 2 ng/mL (ref 0.0–4.0)

## 2020-10-26 ENCOUNTER — Encounter: Payer: Self-pay | Admitting: Family Medicine

## 2020-10-26 ENCOUNTER — Ambulatory Visit (INDEPENDENT_AMBULATORY_CARE_PROVIDER_SITE_OTHER): Payer: Medicare Other | Admitting: Family Medicine

## 2020-10-26 ENCOUNTER — Other Ambulatory Visit: Payer: Self-pay

## 2020-10-26 VITALS — BP 134/82 | HR 103 | Ht 66.5 in | Wt 213.0 lb

## 2020-10-26 DIAGNOSIS — Z0001 Encounter for general adult medical examination with abnormal findings: Secondary | ICD-10-CM | POA: Diagnosis not present

## 2020-10-26 DIAGNOSIS — E8809 Other disorders of plasma-protein metabolism, not elsewhere classified: Secondary | ICD-10-CM | POA: Diagnosis not present

## 2020-10-26 DIAGNOSIS — E7849 Other hyperlipidemia: Secondary | ICD-10-CM | POA: Diagnosis not present

## 2020-10-26 DIAGNOSIS — R011 Cardiac murmur, unspecified: Secondary | ICD-10-CM

## 2020-10-26 DIAGNOSIS — M25512 Pain in left shoulder: Secondary | ICD-10-CM

## 2020-10-26 DIAGNOSIS — I10 Essential (primary) hypertension: Secondary | ICD-10-CM

## 2020-10-26 DIAGNOSIS — Z0000 Encounter for general adult medical examination without abnormal findings: Secondary | ICD-10-CM

## 2020-10-26 MED ORDER — AMLODIPINE BESYLATE 2.5 MG PO TABS: 2.5000 mg | ORAL_TABLET | Freq: Every day | ORAL | 1 refills | Status: DC

## 2020-10-26 MED ORDER — LISINOPRIL 20 MG PO TABS: 20.0000 mg | ORAL_TABLET | Freq: Every day | ORAL | 1 refills | Status: DC

## 2020-10-26 NOTE — Patient Instructions (Signed)

## 2020-10-26 NOTE — Progress Notes (Signed)
Subjective:    Patient ID: Joseph Rodgers, male    DOB: 1950-06-03, 70 y.o.   MRN: [id number]  HPI Annual Medicare Wellness Exam AWV- Annual Wellness Visit  The patient was seen for their annual wellness visit. The patient's past medical history, surgical history, and family history were reviewed. Pertinent vaccines were reviewed ( tetanus, pneumonia, shingles, flu) The patient's medication list was reviewed and updated.  The height and weight were entered.  BMI recorded in electronic record elsewhere  Cognitive screening was completed. Outcome of Mini - Cog: 5   Falls /depression screening electronically recorded within record elsewhere  Current tobacco usage:no (All patients who use tobacco were given written and verbal information on quitting)  Recent listing of emergency department/hospitalizations over the past year were reviewed.  current specialist the patient sees on a regular basis: no The patient comes in today for a wellness visit.    A review of their health history was completed.  A review of medications was also completed.  Any needed refills; will provide refills today  Eating habits: Trying to eat healthy  Falls/  MVA accidents in past few months: No recent falls denies being depressed  Regular exercise: [place] some walking exercise bike  Specialist pt sees on regular basis: Does not see any specialist currently  Preventative health issues were discussed.   Additional concerns: Has some trouble with his left shoulder off and on since a strain getting into a tub Also intermittent feeling of thickness and fullness in his right foot with certain movements    Medicare annual wellness visit patient questionnaire was reviewed.  A written screening schedule for the patient for the next 5-10 years was given. Appropriate discussion of followup regarding next visit was discussed.  Results for orders placed or performed in visit on 69/48/54  Basic  Metabolic Panel (BMET)  Result Value Ref Range   Glucose 105 (H) 65 - 99 mg/dL   BUN 16 8 - 27 mg/dL   Creatinine, Ser 1.16 0.76 - 1.27 mg/dL   eGFR 68 >59 mL/min/1.73   BUN/Creatinine Ratio 14 10 - 24   Sodium 136 134 - 144 mmol/L   Potassium 4.1 3.5 - 5.2 mmol/L   Chloride 97 96 - 106 mmol/L   CO2 21 20 - 29 mmol/L   Calcium 9.8 8.6 - 10.2 mg/dL  Lipid Profile  Result Value Ref Range   Cholesterol, Total 261 (H) 100 - 199 mg/dL   Triglycerides 339 (H) 0 - 149 mg/dL   HDL 39 (L) >39 mg/dL   VLDL Cholesterol Cal 64 (H) 5 - 40 mg/dL   LDL Chol Calc (NIH) 158 (H) 0 - 99 mg/dL   Chol/HDL Ratio 6.7 (H) 0.0 - 5.0 ratio  Hepatic function panel  Result Value Ref Range   Total Protein 7.4 6.0 - 8.5 g/dL   Albumin 5.1 (H) 3.8 - 4.8 g/dL   Bilirubin Total 0.6 0.0 - 1.2 mg/dL   Bilirubin, Direct 0.12 0.00 - 0.40 mg/dL   Alkaline Phosphatase 73 44 - 121 IU/L   AST 33 0 - 40 IU/L   ALT 30 0 - 44 IU/L  PSA  Result Value Ref Range   Prostate Specific Ag, Serum 2.0 0.0 - 4.0 ng/mL       Review of Systems     Objective:   Physical Exam  General-in no acute distress Eyes-no discharge Lungs-respiratory rate normal, CTA CV-systolic soft murmurs,RRR Extremities skin warm dry no edema Neuro grossly normal Behavior  normal, alert  Prostate exam not indicated     Assessment & Plan:  1. Well adult exam Adult wellness-complete.wellness physical was conducted today. Importance of diet and exercise were discussed in detail.  In addition to this a discussion regarding safety was also covered. We also reviewed over immunizations and gave recommendations regarding current immunization needed for age.  In addition to this additional areas were also touched on including: Preventative health exams needed:  Colonoscopy Next 1 is due 2024  Patient was advised yearly wellness exam  - Lipid panel - Hepatic function panel  2. Encounter for subsequent annual wellness visit (AWV) in Medicare  patient Shingrix vaccine recommended Booster COVID recommended Flu recommended - Lipid panel - Hepatic function panel  3. Left shoulder pain, unspecified chronicity Exercises shown and given to the patient to do if not better within the next couple months notify us we will set up for orthopedics and physical therapy  4. Primary hypertension Blood pressure good control continue current measures - Lipid panel - Hepatic function panel  5. Other hyperlipidemia Because hyperlipidemia recommend injectable cholesterol medicine patient defers currently we will recheck cholesterol 6 months - Lipid panel - Hepatic function panel  6. Hyperalbuminemia Slight elevation of albumin recheck this again in 6 months if it is trending upward will need further work-up - Lipid panel - Hepatic function panel  7. Systolic murmur Stable.  See previous echo   

## 2020-11-30 DIAGNOSIS — H1132 Conjunctival hemorrhage, left eye: Secondary | ICD-10-CM | POA: Diagnosis not present

## 2021-01-11 ENCOUNTER — Telehealth: Payer: Self-pay | Admitting: *Deleted

## 2021-01-11 NOTE — Chronic Care Management (AMB) (Signed)
  Chronic Care Management   Note  01/11/2021 Name: DEONDRAY OSPINA MRN: [id number] DOB: 10-May-1950  WEYLIN PLAGGE is a 70 y.o. year old male who is a primary care patient of Luking, Elayne Snare, MD. I reached out to [name] by phone today in response to a referral sent by Mr. Massimo Hartland Metzer's PCP.  Mr. Mickelson was given information about Chronic Care Management services today including:  CCM service includes personalized support from designated clinical staff supervised by his physician, including individualized plan of care and coordination with other care providers 24/7 contact phone numbers for assistance for urgent and routine care needs. Service will only be billed when office clinical staff spend 20 minutes or more in a month to coordinate care. Only one practitioner may furnish and bill the service in a calendar month. The patient may stop CCM services at any time (effective at the end of the month) by phone call to the office staff. The patient is responsible for co-pay (up to 20% after annual deductible is met) if co-pay is required by the individual health plan.   Patient agreed to services and verbal consent obtained.   Follow up plan: Telephone appointment with care management team member scheduled for:01/15/21  [place]: [phone]

## 2021-01-15 ENCOUNTER — Telehealth: Payer: Medicare Other

## 2021-01-15 ENCOUNTER — Telehealth: Payer: Self-pay | Admitting: *Deleted

## 2021-01-15 NOTE — Chronic Care Management (AMB) (Signed)
  Care Management   Note  01/15/2021 Name: Joseph Rodgers MRN: [id number] DOB: 11/30/1950  Joseph Rodgers is a 70 y.o. year old male who is a primary care patient of Luking, Elayne Snare, MD and is actively engaged with the care management team. I reached out to [name] by phone today to assist with re-scheduling an initial visit with the Pharmacist  Follow up plan: Patient declines further follow up and engagement by the care management team. Appropriate care team members and provider have been notified via electronic communication. The care management team is available to follow up with the patient after provider conversation with the patient regarding recommendation for care management engagement and subsequent re-referral to the care management team.   [place] Management  Direct Dial: [phone]

## 2021-02-18 DIAGNOSIS — L720 Epidermal cyst: Secondary | ICD-10-CM | POA: Diagnosis not present

## 2021-02-18 DIAGNOSIS — D485 Neoplasm of uncertain behavior of skin: Secondary | ICD-10-CM | POA: Diagnosis not present

## 2021-02-18 DIAGNOSIS — Z1283 Encounter for screening for malignant neoplasm of skin: Secondary | ICD-10-CM | POA: Diagnosis not present

## 2021-02-18 DIAGNOSIS — X32XXXD Exposure to sunlight, subsequent encounter: Secondary | ICD-10-CM | POA: Diagnosis not present

## 2021-02-18 DIAGNOSIS — D225 Melanocytic nevi of trunk: Secondary | ICD-10-CM | POA: Diagnosis not present

## 2021-02-18 DIAGNOSIS — L570 Actinic keratosis: Secondary | ICD-10-CM | POA: Diagnosis not present

## 2021-02-18 DIAGNOSIS — L820 Inflamed seborrheic keratosis: Secondary | ICD-10-CM | POA: Diagnosis not present

## 2021-04-20 ENCOUNTER — Other Ambulatory Visit: Payer: Self-pay | Admitting: Family Medicine

## 2021-04-20 DIAGNOSIS — Z0000 Encounter for general adult medical examination without abnormal findings: Secondary | ICD-10-CM | POA: Diagnosis not present

## 2021-04-20 DIAGNOSIS — I10 Essential (primary) hypertension: Secondary | ICD-10-CM | POA: Diagnosis not present

## 2021-04-20 DIAGNOSIS — E8809 Other disorders of plasma-protein metabolism, not elsewhere classified: Secondary | ICD-10-CM | POA: Diagnosis not present

## 2021-04-20 DIAGNOSIS — E7849 Other hyperlipidemia: Secondary | ICD-10-CM | POA: Diagnosis not present

## 2021-04-21 LAB — LIPID PANEL
Chol/HDL Ratio: 6.7 ratio — ABNORMAL HIGH (ref 0.0–5.0)
Cholesterol, Total: 247 mg/dL — ABNORMAL HIGH (ref 100–199)
HDL: 37 mg/dL — ABNORMAL LOW (ref >39)
LDL Chol Calc (NIH): 161 mg/dL — ABNORMAL HIGH (ref 0–99)
Triglycerides: 261 mg/dL — ABNORMAL HIGH (ref 0–149)
VLDL Cholesterol Cal: 49 mg/dL — ABNORMAL HIGH (ref 5–40)

## 2021-04-21 LAB — HEPATIC FUNCTION PANEL
ALT: 25 IU/L (ref 0–44)
AST: 26 IU/L (ref 0–40)
Albumin: 4.7 g/dL (ref 3.7–4.7)
Alkaline Phosphatase: 69 IU/L (ref 44–121)
Bilirubin Total: 0.5 mg/dL (ref 0.0–1.2)
Bilirubin, Direct: 0.13 mg/dL (ref 0.00–0.40)
Total Protein: 7.3 g/dL (ref 6.0–8.5)

## 2021-04-22 DIAGNOSIS — H43393 Other vitreous opacities, bilateral: Secondary | ICD-10-CM | POA: Diagnosis not present

## 2021-04-26 ENCOUNTER — Ambulatory Visit (INDEPENDENT_AMBULATORY_CARE_PROVIDER_SITE_OTHER): Payer: Medicare Other | Admitting: Family Medicine

## 2021-04-26 ENCOUNTER — Encounter: Payer: Self-pay | Admitting: Family Medicine

## 2021-04-26 ENCOUNTER — Other Ambulatory Visit: Payer: Self-pay

## 2021-04-26 VITALS — BP 132/72 | Temp 99.5°F | Wt 212.6 lb

## 2021-04-26 DIAGNOSIS — I10 Essential (primary) hypertension: Secondary | ICD-10-CM | POA: Diagnosis not present

## 2021-04-26 DIAGNOSIS — E7849 Other hyperlipidemia: Secondary | ICD-10-CM

## 2021-04-26 DIAGNOSIS — I700 Atherosclerosis of aorta: Secondary | ICD-10-CM | POA: Diagnosis not present

## 2021-04-26 MED ORDER — LISINOPRIL 20 MG PO TABS: 20.0000 mg | ORAL_TABLET | Freq: Every day | ORAL | 1 refills | Status: DC

## 2021-04-26 MED ORDER — AMLODIPINE BESYLATE 2.5 MG PO TABS: 2.5000 mg | ORAL_TABLET | Freq: Every day | ORAL | 1 refills | Status: DC

## 2021-04-26 NOTE — Patient Instructions (Addendum)
Results for orders placed or performed in visit on 10/26/20  ?Lipid panel  ?Result Value Ref Range  ? Cholesterol, Total 247 (H) 100 - 199 mg/dL  ? Triglycerides 261 (H) 0 - 149 mg/dL  ? HDL 37 (L) >39 mg/dL  ? VLDL Cholesterol Cal 49 (H) 5 - 40 mg/dL  ? LDL Chol Calc (NIH) 161 (H) 0 - 99 mg/dL  ? Chol/HDL Ratio 6.7 (H) 0.0 - 5.0 ratio  ?Hepatic function panel  ?Result Value Ref Range  ? Total Protein 7.3 6.0 - 8.5 g/dL  ? Albumin 4.7 3.7 - 4.7 g/dL  ? Bilirubin Total 0.5 0.0 - 1.2 mg/dL  ? Bilirubin, Direct 0.13 0.00 - 0.40 mg/dL  ? Alkaline Phosphatase 69 44 - 121 IU/L  ? AST 26 0 - 40 IU/L  ? ALT 25 0 - 44 IU/L  ? ?Shingrix and shingles prevention: know the facts! ? ? ?Shingrix is a very effective vaccine to prevent shingles.   ?Shingles is a reactivation of chickenpox -more than 99% of Americans born before 1980 have had chickenpox even if they do not remember it. ?One in every 10 people who get shingles have severe long-lasting nerve pain as a result.   ?33 out of a 100 older adults will get shingles if they are unvaccinated.   ? ? ?This vaccine is very important for your health ?This vaccine is indicated for anyone 50 years or older. ?You can get this vaccine even if you have already had shingles because you can get the disease more than once in a lifetime. ? ?Your risk for shingles and its complications increases with age. ? ?This vaccine has 2 doses.  The second dose would be 2 to 6 months after the first dose.  ?If you had Zostavax vaccine in the past you should still get Shingrix. ?( Zostavax is only 70% effective and it loses significant strength over a few years .) ? ?This vaccine is given through the pharmacy.  The cost of the vaccine is through your insurance. ?The pharmacy can inform you of the total costs. ? ?Common side effects including soreness in the arm, some redness and swelling, also some feel fatigue muscle soreness headache low-grade fever.  Side effects typically go away within 2 to 3  days. ?Remember-the pain from shingles can last a lifetime but these side effects of the vaccine will only last a few days at most. ?It is very important to get both doses in order to protect yourself fully.  ? ?Please get this vaccine at your earliest convenience at your trusted pharmacy. ? ? ? ? ? ?

## 2021-04-26 NOTE — Progress Notes (Signed)
? ?  Subjective:  ? ? Patient ID: Joseph Rodgers, male    DOB: 1951/01/17, 71 y.o.   MRN: [id number] ? ?HPI ?Pt here for follow up on HTN. Blood pressure has been doing good. Taking all meds as directed.  ?Trying to stay active ? ?Intermittent neck and shoulder pain ?Hyperlipidemia through the years ?Patient does not tolerate statins ?He is interested in trying to see if he is at risk for heart disease ?We did discuss his CT scan findings from several years ago ?We also discussed coronary calcium scores ? ?Shingrix ? ? ? ?Review of Systems ? ?   ?Objective:  ? Physical Exam ? ?General-in no acute distress ?Eyes-no discharge ?Lungs-respiratory rate normal, CTA ?CV-no murmurs,RRR ?Extremities skin warm dry no edema ?Neuro grossly normal ?Behavior normal, alert ? ? ? ?   ?Assessment & Plan:  ?Intermittent upper back shoulder pains and discomfort no sign of any major issues here more than likely musculoskeletal ? ?Blood pressure good control continue current measures ? ?Hyperlipidemia patient chooses not to be on any additional cholesterol medicine currently does not tolerate statins ? ?We did discuss aortic atherosclerosis and calcifications along with benefit of doing a coronary calcium test to help rule out the possibility of advancing coronary artery disease and patient would consider injectable cholesterol medicines depending on the results ? ?Follow-up within 6 months ? ?

## 2021-05-12 ENCOUNTER — Ambulatory Visit: Payer: Medicare Other

## 2021-06-17 DIAGNOSIS — L570 Actinic keratosis: Secondary | ICD-10-CM | POA: Diagnosis not present

## 2021-06-17 DIAGNOSIS — D225 Melanocytic nevi of trunk: Secondary | ICD-10-CM | POA: Diagnosis not present

## 2021-06-17 DIAGNOSIS — C4441 Basal cell carcinoma of skin of scalp and neck: Secondary | ICD-10-CM | POA: Diagnosis not present

## 2021-06-17 DIAGNOSIS — Z1283 Encounter for screening for malignant neoplasm of skin: Secondary | ICD-10-CM | POA: Diagnosis not present

## 2021-06-17 DIAGNOSIS — L820 Inflamed seborrheic keratosis: Secondary | ICD-10-CM | POA: Diagnosis not present

## 2021-06-17 DIAGNOSIS — I872 Venous insufficiency (chronic) (peripheral): Secondary | ICD-10-CM | POA: Diagnosis not present

## 2021-06-17 DIAGNOSIS — X32XXXD Exposure to sunlight, subsequent encounter: Secondary | ICD-10-CM | POA: Diagnosis not present

## 2021-07-29 DIAGNOSIS — C4441 Basal cell carcinoma of skin of scalp and neck: Secondary | ICD-10-CM | POA: Diagnosis not present

## 2021-09-09 DIAGNOSIS — C4441 Basal cell carcinoma of skin of scalp and neck: Secondary | ICD-10-CM | POA: Diagnosis not present

## 2021-10-13 ENCOUNTER — Telehealth: Payer: Self-pay | Admitting: Family Medicine

## 2021-10-13 NOTE — Telephone Encounter (Signed)
Patient has physical on 9/13 and needing labs done.

## 2021-10-14 ENCOUNTER — Other Ambulatory Visit: Payer: Self-pay

## 2021-10-14 DIAGNOSIS — E7849 Other hyperlipidemia: Secondary | ICD-10-CM

## 2021-10-14 DIAGNOSIS — Z125 Encounter for screening for malignant neoplasm of prostate: Secondary | ICD-10-CM

## 2021-10-14 DIAGNOSIS — I10 Essential (primary) hypertension: Secondary | ICD-10-CM

## 2021-10-14 NOTE — Telephone Encounter (Signed)
PSA, lipid, liver, metabolic 7 Hyperlipidemia, hypertension, wellness, screening for prostate cancer

## 2021-10-14 NOTE — Telephone Encounter (Signed)
Patient has been informed per drs lab orders.

## 2021-10-21 DIAGNOSIS — I10 Essential (primary) hypertension: Secondary | ICD-10-CM | POA: Diagnosis not present

## 2021-10-21 DIAGNOSIS — E7849 Other hyperlipidemia: Secondary | ICD-10-CM | POA: Diagnosis not present

## 2021-10-22 LAB — BASIC METABOLIC PANEL
BUN/Creatinine Ratio: 16 (ref 10–24)
BUN: 17 mg/dL (ref 8–27)
CO2: 19 mmol/L — ABNORMAL LOW (ref 20–29)
Calcium: 9.8 mg/dL (ref 8.6–10.2)
Chloride: 99 mmol/L (ref 96–106)
Creatinine, Ser: 1.06 mg/dL (ref 0.76–1.27)
Glucose: 116 mg/dL — ABNORMAL HIGH (ref 70–99)
Potassium: 4.2 mmol/L (ref 3.5–5.2)
Sodium: 136 mmol/L (ref 134–144)
eGFR: 75 mL/min/{1.73_m2} (ref >59)

## 2021-10-22 LAB — HEPATIC FUNCTION PANEL
ALT: 27 IU/L (ref 0–44)
AST: 34 IU/L (ref 0–40)
Albumin: 4.9 g/dL — ABNORMAL HIGH (ref 3.8–4.8)
Alkaline Phosphatase: 70 IU/L (ref 44–121)
Bilirubin Total: 0.7 mg/dL (ref 0.0–1.2)
Bilirubin, Direct: 0.15 mg/dL (ref 0.00–0.40)
Total Protein: 7.2 g/dL (ref 6.0–8.5)

## 2021-10-22 LAB — LIPID PANEL
Chol/HDL Ratio: 5.8 ratio — ABNORMAL HIGH (ref 0.0–5.0)
Cholesterol, Total: 239 mg/dL — ABNORMAL HIGH (ref 100–199)
HDL: 41 mg/dL (ref >39)
LDL Chol Calc (NIH): 156 mg/dL — ABNORMAL HIGH (ref 0–99)
Triglycerides: 229 mg/dL — ABNORMAL HIGH (ref 0–149)
VLDL Cholesterol Cal: 42 mg/dL — ABNORMAL HIGH (ref 5–40)

## 2021-10-22 LAB — PSA: Prostate Specific Ag, Serum: 2.1 ng/mL (ref 0.0–4.0)

## 2021-10-27 ENCOUNTER — Ambulatory Visit (INDEPENDENT_AMBULATORY_CARE_PROVIDER_SITE_OTHER): Payer: Medicare Other | Admitting: Family Medicine

## 2021-10-27 ENCOUNTER — Encounter: Payer: Self-pay | Admitting: Family Medicine

## 2021-10-27 VITALS — BP 138/82 | Ht 64.75 in | Wt 209.8 lb

## 2021-10-27 DIAGNOSIS — I700 Atherosclerosis of aorta: Secondary | ICD-10-CM | POA: Diagnosis not present

## 2021-10-27 DIAGNOSIS — T466X5A Adverse effect of antihyperlipidemic and antiarteriosclerotic drugs, initial encounter: Secondary | ICD-10-CM | POA: Diagnosis not present

## 2021-10-27 DIAGNOSIS — M791 Myalgia, unspecified site: Secondary | ICD-10-CM | POA: Diagnosis not present

## 2021-10-27 DIAGNOSIS — Z23 Encounter for immunization: Secondary | ICD-10-CM | POA: Diagnosis not present

## 2021-10-27 DIAGNOSIS — N401 Enlarged prostate with lower urinary tract symptoms: Secondary | ICD-10-CM

## 2021-10-27 DIAGNOSIS — R010 Benign and innocent cardiac murmurs: Secondary | ICD-10-CM | POA: Diagnosis not present

## 2021-10-27 DIAGNOSIS — R3912 Poor urinary stream: Secondary | ICD-10-CM

## 2021-10-27 DIAGNOSIS — I10 Essential (primary) hypertension: Secondary | ICD-10-CM | POA: Diagnosis not present

## 2021-10-27 DIAGNOSIS — E7849 Other hyperlipidemia: Secondary | ICD-10-CM | POA: Diagnosis not present

## 2021-10-27 DIAGNOSIS — Z0000 Encounter for general adult medical examination without abnormal findings: Secondary | ICD-10-CM

## 2021-10-27 DIAGNOSIS — R059 Cough, unspecified: Secondary | ICD-10-CM

## 2021-10-27 MED ORDER — LISINOPRIL 20 MG PO TABS: 20.0000 mg | ORAL_TABLET | Freq: Every day | ORAL | 1 refills | Status: DC

## 2021-10-27 MED ORDER — TAMSULOSIN HCL 0.4 MG PO CAPS: 0.4000 mg | ORAL_CAPSULE | Freq: Every day | ORAL | 5 refills | Status: DC

## 2021-10-27 MED ORDER — AMLODIPINE BESYLATE 2.5 MG PO TABS: 2.5000 mg | ORAL_TABLET | Freq: Every day | ORAL | 1 refills | Status: DC

## 2021-10-27 NOTE — Patient Instructions (Signed)

## 2021-10-27 NOTE — Progress Notes (Signed)
   Subjective:    Patient ID: Joseph Rodgers, male    DOB: 30-Mar-1950, 71 y.o.   MRN: [id number]  HPI AWV- Annual Wellness Visit  The patient was seen for their annual wellness visit. The patient's past medical history, surgical history, and family history were reviewed. Pertinent vaccines were reviewed ( tetanus, pneumonia, shingles, flu) The patient's medication list was reviewed and updated.  The height and weight were entered.  BMI recorded in electronic record elsewhere  Cognitive screening was completed. Outcome of Mini - Cog: Pass   Falls /depression screening electronically recorded within record elsewhere  Current tobacco usage: none (All patients who use tobacco were given written and verbal information on quitting)  Recent listing of emergency department/hospitalizations over the past year were reviewed.  current specialist the patient sees on a regular basis: dermatologist   Medicare annual wellness visit patient questionnaire was reviewed.  A written screening schedule for the patient for the next 5-10 years was given. Appropriate discussion of followup regarding next visit was discussed.      Review of Systems     Objective:   Physical Exam General-in no acute distress Eyes-no discharge Lungs-respiratory rate normal, CTA CV-no murmurs,RRR Extremities skin warm dry no edema Neuro grossly normal Behavior normal, alert Prostate mildly enlarged no hardness       Assessment & Plan:  1. Well adult exam Adult wellness-complete.wellness physical was conducted today. Importance of diet and exercise were discussed in detail.  Importance of stress reduction and healthy living were discussed.  In addition to this a discussion regarding safety was also covered.  We also reviewed over immunizations and gave recommendations regarding current immunization needed for age.   In addition to this additional areas were also touched on including: Preventative  health exams needed:  Colonoscopy 2024  Patient was advised yearly wellness exam   2. Primary hypertension Blood pressure good control continue current measures  3. Other hyperlipidemia Cholesterol is significantly elevated patient does not tolerate statins  4. Aortic atherosclerosis (HCC) Does not tolerate statins.  Patient does not want to do coronary calcium test.  Patient will try to just eat healthy.  Is not interested in other measures currently  5. Benign prostatic hyperplasia with weak urinary stream Patient will try tamsulosin at nighttime every other day to see if that helps but does not help enough.  Do it every day he was not instructed that if it caused any type of side effects or rash to stop medicine immediately it should be noted that his reported sulfa allergy was something that happened back in the 90s a rash in his privates when he was on the sulfa medicine  6. Benign heart murmur Previous echo normal  7. Cough, unspecified type More than likely related to recent viral illness if it does not clear up in the next few weeks let us know  8. Need for vaccination See above - Flu Vaccine QUAD High Dose(Fluad) Should be noted patient has history of myalgias with statins Annual wellness visit completed

## 2021-12-23 DIAGNOSIS — Z08 Encounter for follow-up examination after completed treatment for malignant neoplasm: Secondary | ICD-10-CM | POA: Diagnosis not present

## 2021-12-23 DIAGNOSIS — Z85828 Personal history of other malignant neoplasm of skin: Secondary | ICD-10-CM | POA: Diagnosis not present

## 2021-12-23 DIAGNOSIS — L820 Inflamed seborrheic keratosis: Secondary | ICD-10-CM | POA: Diagnosis not present

## 2021-12-23 DIAGNOSIS — C4442 Squamous cell carcinoma of skin of scalp and neck: Secondary | ICD-10-CM | POA: Diagnosis not present

## 2022-02-03 DIAGNOSIS — Z08 Encounter for follow-up examination after completed treatment for malignant neoplasm: Secondary | ICD-10-CM | POA: Diagnosis not present

## 2022-02-03 DIAGNOSIS — C4441 Basal cell carcinoma of skin of scalp and neck: Secondary | ICD-10-CM | POA: Diagnosis not present

## 2022-02-03 DIAGNOSIS — Z85828 Personal history of other malignant neoplasm of skin: Secondary | ICD-10-CM | POA: Diagnosis not present

## 2022-03-17 ENCOUNTER — Telehealth: Payer: Self-pay | Admitting: Family Medicine

## 2022-03-17 ENCOUNTER — Other Ambulatory Visit: Payer: Self-pay

## 2022-03-17 DIAGNOSIS — Z08 Encounter for follow-up examination after completed treatment for malignant neoplasm: Secondary | ICD-10-CM | POA: Diagnosis not present

## 2022-03-17 DIAGNOSIS — I10 Essential (primary) hypertension: Secondary | ICD-10-CM

## 2022-03-17 DIAGNOSIS — C44319 Basal cell carcinoma of skin of other parts of face: Secondary | ICD-10-CM | POA: Diagnosis not present

## 2022-03-17 DIAGNOSIS — E7849 Other hyperlipidemia: Secondary | ICD-10-CM

## 2022-03-17 DIAGNOSIS — Z85828 Personal history of other malignant neoplasm of skin: Secondary | ICD-10-CM | POA: Diagnosis not present

## 2022-03-17 DIAGNOSIS — L570 Actinic keratosis: Secondary | ICD-10-CM | POA: Diagnosis not present

## 2022-03-17 DIAGNOSIS — L820 Inflamed seborrheic keratosis: Secondary | ICD-10-CM | POA: Diagnosis not present

## 2022-03-17 DIAGNOSIS — R7301 Impaired fasting glucose: Secondary | ICD-10-CM

## 2022-03-17 DIAGNOSIS — X32XXXD Exposure to sunlight, subsequent encounter: Secondary | ICD-10-CM | POA: Diagnosis not present

## 2022-03-17 NOTE — Telephone Encounter (Signed)
Blood  work ordered in [employer]. Patient aware.

## 2022-03-17 NOTE — Telephone Encounter (Signed)
Nurses-recommend CMP with estimated GFR, lipid, A1c Diagnosis fasting hyperglycemia, hyperlipidemia, hypertension

## 2022-03-17 NOTE — Telephone Encounter (Signed)
Last labs completed 10/21/21 BMET, Hepatic, Lipid, PSA. Please advise. Thank you

## 2022-03-17 NOTE — Telephone Encounter (Signed)
Patient has follow up appointment on 2/14 and wanting to know if he needed labs done.

## 2022-03-24 DIAGNOSIS — I10 Essential (primary) hypertension: Secondary | ICD-10-CM | POA: Diagnosis not present

## 2022-03-24 DIAGNOSIS — E7849 Other hyperlipidemia: Secondary | ICD-10-CM | POA: Diagnosis not present

## 2022-03-24 DIAGNOSIS — R7301 Impaired fasting glucose: Secondary | ICD-10-CM | POA: Diagnosis not present

## 2022-03-25 LAB — CMP14+EGFR
ALT: 25 IU/L (ref 0–44)
AST: 29 IU/L (ref 0–40)
Albumin/Globulin Ratio: 1.8 (ref 1.2–2.2)
Albumin: 4.8 g/dL (ref 3.8–4.8)
Alkaline Phosphatase: 73 IU/L (ref 44–121)
BUN/Creatinine Ratio: 14 (ref 10–24)
BUN: 14 mg/dL (ref 8–27)
Bilirubin Total: 0.6 mg/dL (ref 0.0–1.2)
CO2: 24 mmol/L (ref 20–29)
Calcium: 9.9 mg/dL (ref 8.6–10.2)
Chloride: 99 mmol/L (ref 96–106)
Creatinine, Ser: 1.03 mg/dL (ref 0.76–1.27)
Globulin, Total: 2.6 g/dL (ref 1.5–4.5)
Glucose: 104 mg/dL — ABNORMAL HIGH (ref 70–99)
Potassium: 5.1 mmol/L (ref 3.5–5.2)
Sodium: 140 mmol/L (ref 134–144)
Total Protein: 7.4 g/dL (ref 6.0–8.5)
eGFR: 77 mL/min/{1.73_m2} (ref >59)

## 2022-03-25 LAB — LIPID PANEL
Chol/HDL Ratio: 5.9 ratio — ABNORMAL HIGH (ref 0.0–5.0)
Cholesterol, Total: 252 mg/dL — ABNORMAL HIGH (ref 100–199)
HDL: 43 mg/dL (ref >39)
LDL Chol Calc (NIH): 168 mg/dL — ABNORMAL HIGH (ref 0–99)
Triglycerides: 218 mg/dL — ABNORMAL HIGH (ref 0–149)
VLDL Cholesterol Cal: 41 mg/dL — ABNORMAL HIGH (ref 5–40)

## 2022-03-25 LAB — HEMOGLOBIN A1C
Est. average glucose Bld gHb Est-mCnc: 123 mg/dL
Hgb A1c MFr Bld: 5.9 % — ABNORMAL HIGH (ref 4.8–5.6)

## 2022-03-30 ENCOUNTER — Ambulatory Visit (INDEPENDENT_AMBULATORY_CARE_PROVIDER_SITE_OTHER): Payer: Medicare Other | Admitting: Family Medicine

## 2022-03-30 ENCOUNTER — Ambulatory Visit: Payer: Medicare Other | Admitting: Family Medicine

## 2022-03-30 VITALS — BP 134/73 | Wt 207.0 lb

## 2022-03-30 DIAGNOSIS — E7849 Other hyperlipidemia: Secondary | ICD-10-CM

## 2022-03-30 DIAGNOSIS — T466X5A Adverse effect of antihyperlipidemic and antiarteriosclerotic drugs, initial encounter: Secondary | ICD-10-CM | POA: Diagnosis not present

## 2022-03-30 DIAGNOSIS — R3912 Poor urinary stream: Secondary | ICD-10-CM | POA: Diagnosis not present

## 2022-03-30 DIAGNOSIS — N401 Enlarged prostate with lower urinary tract symptoms: Secondary | ICD-10-CM | POA: Diagnosis not present

## 2022-03-30 DIAGNOSIS — I10 Essential (primary) hypertension: Secondary | ICD-10-CM

## 2022-03-30 DIAGNOSIS — I700 Atherosclerosis of aorta: Secondary | ICD-10-CM | POA: Diagnosis not present

## 2022-03-30 DIAGNOSIS — R7303 Prediabetes: Secondary | ICD-10-CM | POA: Diagnosis not present

## 2022-03-30 DIAGNOSIS — M791 Myalgia, unspecified site: Secondary | ICD-10-CM

## 2022-03-30 MED ORDER — TAMSULOSIN HCL 0.4 MG PO CAPS: 0.4000 mg | ORAL_CAPSULE | Freq: Every day | ORAL | 1 refills | Status: DC

## 2022-03-30 MED ORDER — LISINOPRIL 20 MG PO TABS: 20.0000 mg | ORAL_TABLET | Freq: Every day | ORAL | 1 refills | Status: DC

## 2022-03-30 MED ORDER — AMLODIPINE BESYLATE 2.5 MG PO TABS: 2.5000 mg | ORAL_TABLET | Freq: Every day | ORAL | 1 refills | Status: DC

## 2022-03-30 NOTE — Progress Notes (Signed)
Subjective:    Patient ID: Joseph Rodgers, male    DOB: 05/22/1950, 72 y.o.   MRN: [id number]  HPI  Patient arrives for 6 month follow and lab work.  Primary hypertension  Aortic atherosclerosis ([place]), Chronic  Benign prostatic hyperplasia with weak urinary stream  Other hyperlipidemia  Myalgia due to statin  Results for orders placed or performed in visit on 03/17/22  CMP14+EGFR  Result Value Ref Range   Glucose 104 (H) 70 - 99 mg/dL   BUN 14 8 - 27 mg/dL   Creatinine, Ser 1.03 0.76 - 1.27 mg/dL   eGFR 77 >59 mL/min/1.73   BUN/Creatinine Ratio 14 10 - 24   Sodium 140 134 - 144 mmol/L   Potassium 5.1 3.5 - 5.2 mmol/L   Chloride 99 96 - 106 mmol/L   CO2 24 20 - 29 mmol/L   Calcium 9.9 8.6 - 10.2 mg/dL   Total Protein 7.4 6.0 - 8.5 g/dL   Albumin 4.8 3.8 - 4.8 g/dL   Globulin, Total 2.6 1.5 - 4.5 g/dL   Albumin/Globulin Ratio 1.8 1.2 - 2.2   Bilirubin Total 0.6 0.0 - 1.2 mg/dL   Alkaline Phosphatase 73 44 - 121 IU/L   AST 29 0 - 40 IU/L   ALT 25 0 - 44 IU/L  Lipid Panel  Result Value Ref Range   Cholesterol, Total 252 (H) 100 - 199 mg/dL   Triglycerides 218 (H) 0 - 149 mg/dL   HDL 43 >39 mg/dL   VLDL Cholesterol Cal 41 (H) 5 - 40 mg/dL   LDL Chol Calc (NIH) 168 (H) 0 - 99 mg/dL   Chol/HDL Ratio 5.9 (H) 0.0 - 5.0 ratio  Hemoglobin A1c  Result Value Ref Range   Hgb A1c MFr Bld 5.9 (H) 4.8 - 5.6 %   Est. average glucose Bld gHb Est-mCnc 123 mg/dL    Patient genetically has very high cholesterol tries to do his best with healthy eating but nonetheless the numbers tend to run high He denies any chest tightness pressure pain or shortness of breath We did discuss coronary calcium score he will think about it and let us know He does have to get up a couple times at night to urinate his flow is not as good as it used to be but the medication does help He does get myalgias with statins therefore he is unable to take statins currently Does take his blood pressure  medicines regular basis try to minimize salt    Review of Systems     Objective:   Physical Exam  General-in no acute distress Eyes-no discharge Lungs-respiratory rate normal, CTA CV-no murmurs,RRR Extremities skin warm dry no edema Neuro grossly normal Behavior normal, alert       Assessment & Plan:  1. Primary hypertension HTN- patient seen for follow-up regarding HTN.   Diet, medication compliance, appropriate labs and refills were completed.   Importance of keeping blood pressure under good control to lessen the risk of complications discussed Regular follow-up visits discussed   2. Aortic atherosclerosis (HCC) Arthrosclerosis is a concern patient will consider coronary calcium scan Patient has tried statins in the past unable to tolerate Injectables too expensive currently   3. Benign prostatic hyperplasia with weak urinary stream Urine flow is okay continue tamsulosin.  4. Other hyperlipidemia Significant elevation healthy diet regular activity recommended patient does not tolerate statins  5. Myalgia due to statin Does not tolerate statins has significant muscle aches with  6. Prediabetes Needs  minimize starches in the diet minimize sugars Fit in regular exercise  Follow-up in approximately 6 to 7 months for wellness as well as other health issues

## 2022-04-25 DIAGNOSIS — H43393 Other vitreous opacities, bilateral: Secondary | ICD-10-CM | POA: Diagnosis not present

## 2022-05-05 DIAGNOSIS — Z08 Encounter for follow-up examination after completed treatment for malignant neoplasm: Secondary | ICD-10-CM | POA: Diagnosis not present

## 2022-05-05 DIAGNOSIS — Z85828 Personal history of other malignant neoplasm of skin: Secondary | ICD-10-CM | POA: Diagnosis not present

## 2022-05-05 DIAGNOSIS — L820 Inflamed seborrheic keratosis: Secondary | ICD-10-CM | POA: Diagnosis not present

## 2022-07-05 ENCOUNTER — Ambulatory Visit (INDEPENDENT_AMBULATORY_CARE_PROVIDER_SITE_OTHER): Payer: Medicare Other | Admitting: Family Medicine

## 2022-07-05 ENCOUNTER — Other Ambulatory Visit: Payer: Self-pay

## 2022-07-05 ENCOUNTER — Encounter: Payer: Self-pay | Admitting: Family Medicine

## 2022-07-05 VITALS — BP 124/76 | HR 105 | Ht 64.75 in | Wt 208.0 lb

## 2022-07-05 DIAGNOSIS — N410 Acute prostatitis: Secondary | ICD-10-CM

## 2022-07-05 DIAGNOSIS — R3912 Poor urinary stream: Secondary | ICD-10-CM

## 2022-07-05 DIAGNOSIS — R7303 Prediabetes: Secondary | ICD-10-CM

## 2022-07-05 DIAGNOSIS — Z125 Encounter for screening for malignant neoplasm of prostate: Secondary | ICD-10-CM

## 2022-07-05 DIAGNOSIS — R3589 Other polyuria: Secondary | ICD-10-CM | POA: Diagnosis not present

## 2022-07-05 DIAGNOSIS — I10 Essential (primary) hypertension: Secondary | ICD-10-CM

## 2022-07-05 DIAGNOSIS — N401 Enlarged prostate with lower urinary tract symptoms: Secondary | ICD-10-CM

## 2022-07-05 DIAGNOSIS — E7849 Other hyperlipidemia: Secondary | ICD-10-CM

## 2022-07-05 LAB — POCT URINALYSIS DIP (CLINITEK)
Bilirubin, UA: NEGATIVE
Blood, UA: NEGATIVE
Glucose, UA: NEGATIVE mg/dL
Leukocytes, UA: NEGATIVE
Nitrite, UA: NEGATIVE
POC PROTEIN,UA: 30 — AB
Spec Grav, UA: >=1.03 — AB (ref 1.010–1.025)
Urobilinogen, UA: 0.2 E.U./dL
pH, UA: 6 (ref 5.0–8.0)

## 2022-07-05 MED ORDER — CIPROFLOXACIN HCL 500 MG PO TABS: 500.0000 mg | ORAL_TABLET | Freq: Two times a day (BID) | ORAL | 0 refills | Status: DC

## 2022-07-05 NOTE — Progress Notes (Signed)
   Subjective:    Patient ID: Joseph Rodgers, male    DOB: 26-Jan-1951, 72 y.o.   MRN: [id number]  HPI Patient arrives today left testicle pain x 1 week. Patient states he has had no trouble urinating. Patient complains of polyuria. Running throbbing aching pain lower pelvic area pain radiates into the testicle increased urination denies dysuria fever chills has history of BPH   Review of Systems     Objective:   Physical Exam  General-in no acute distress Eyes-no discharge Lungs-respiratory rate normal, CTA CV-no murmurs,RRR Extremities skin warm dry no edema Neuro grossly normal Behavior normal, alert  Prostate enlarged and tender     Assessment & Plan:  1. Polyuria Due to the prostatitis - POCT URINALYSIS DIP (CLINITEK)  2. Benign prostatic hyperplasia with weak urinary stream Continue tamsulosin  3. Acute prostatitis Antibiotics as prescribed Cipro twice daily for 3 weeks Hold off on any type of Bactrim Doxycycline is an option but due to patient being out in the sun he opts for Cipro he states in the past he is able to tolerate it for at least 2 weeks if he has trouble with it he will let us know

## 2022-10-12 ENCOUNTER — Other Ambulatory Visit: Payer: Self-pay | Admitting: Family Medicine

## 2022-10-25 DIAGNOSIS — E7849 Other hyperlipidemia: Secondary | ICD-10-CM | POA: Diagnosis not present

## 2022-10-25 DIAGNOSIS — R7303 Prediabetes: Secondary | ICD-10-CM | POA: Diagnosis not present

## 2022-10-25 DIAGNOSIS — I10 Essential (primary) hypertension: Secondary | ICD-10-CM | POA: Diagnosis not present

## 2022-10-31 ENCOUNTER — Ambulatory Visit (INDEPENDENT_AMBULATORY_CARE_PROVIDER_SITE_OTHER): Payer: Medicare Other | Admitting: Family Medicine

## 2022-10-31 ENCOUNTER — Encounter: Payer: Self-pay | Admitting: Family Medicine

## 2022-10-31 VITALS — BP 138/78 | HR 107 | Temp 97.9°F | Wt 207.6 lb

## 2022-10-31 DIAGNOSIS — Z23 Encounter for immunization: Secondary | ICD-10-CM

## 2022-10-31 DIAGNOSIS — Z1211 Encounter for screening for malignant neoplasm of colon: Secondary | ICD-10-CM

## 2022-10-31 DIAGNOSIS — R3912 Poor urinary stream: Secondary | ICD-10-CM | POA: Diagnosis not present

## 2022-10-31 DIAGNOSIS — E7849 Other hyperlipidemia: Secondary | ICD-10-CM | POA: Diagnosis not present

## 2022-10-31 DIAGNOSIS — Z0000 Encounter for general adult medical examination without abnormal findings: Secondary | ICD-10-CM

## 2022-10-31 DIAGNOSIS — N401 Enlarged prostate with lower urinary tract symptoms: Secondary | ICD-10-CM

## 2022-10-31 DIAGNOSIS — I10 Essential (primary) hypertension: Secondary | ICD-10-CM

## 2022-10-31 MED ORDER — LISINOPRIL 20 MG PO TABS: 20.0000 mg | ORAL_TABLET | Freq: Every day | ORAL | 1 refills | Status: DC

## 2022-10-31 NOTE — Progress Notes (Signed)
Subjective:    Patient ID: Joseph Rodgers, male    DOB: Dec 04, 1950, 72 y.o.   MRN: [id number]  HPI AWV- Annual Wellness Visit Here today for wellness Tries to eat healthy for the most part Denies any major setbacks or problems Does have history of hypertension Relates over the past month blood pressure has been low in the afternoons But none of the readings are high He gives [place] extensive number of readings that were reviewed  Patient denies any chest tightness pressure pain shortness of breath denies any rectal bleeding The patient was seen for their annual wellness visit. The patient's past medical history, surgical history, and family history were reviewed. Pertinent vaccines were reviewed ( tetanus, pneumonia, shingles, flu) The patient's medication list was reviewed and updated.  The height and weight were entered.  BMI recorded in electronic record elsewhere  Cognitive screening was completed. Outcome of Mini - Cog: 5   Falls /depression screening electronically recorded within record elsewhere  Current tobacco usage:no (All patients who use tobacco were given written and verbal information on quitting)  Recent listing of emergency department/hospitalizations over the past year were reviewed.  current specialist the patient sees on a regular basis: dermatologist   Medicare annual wellness visit patient questionnaire was reviewed.  A written screening schedule for the patient for the next 5-10 years was given. Appropriate discussion of followup regarding next visit was discussed.    Results for orders placed or performed in visit on 07/05/22  PSA  Result Value Ref Range   Prostate Specific Ag, Serum 3.3 0.0 - 4.0 ng/mL  Hemoglobin A1c  Result Value Ref Range   Hgb A1c MFr Bld 5.9 (H) 4.8 - 5.6 %   Est. average glucose Bld gHb Est-mCnc 123 mg/dL  Basic Metabolic Panel (7)  Result Value Ref Range   Glucose 108 (H) 70 - 99 mg/dL   BUN 13 8 - 27 mg/dL    Creatinine, Ser 7.82 0.76 - 1.27 mg/dL   eGFR 80 >95 AO/ZHY/8.65   BUN/Creatinine Ratio 13 10 - 24   Sodium 138 134 - 144 mmol/L   Potassium 4.9 3.5 - 5.2 mmol/L   Chloride 98 96 - 106 mmol/L   CO2 22 20 - 29 mmol/L     Patient having some spells where he feels lightheaded later in the day his blood pressure log shows very good blood pressures earlier in the day and with relatively low mid afternoon he stopped taking the amlodipine his symptoms are doing better   Review of Systems     Objective:   Physical Exam General-in no acute distress Eyes-no discharge Lungs-respiratory rate normal, CTA CV-no murmurs,RRR Extremities skin warm dry no edema Neuro grossly normal Behavior normal, alert Prostate exam not indicated       Assessment & Plan:  1. Needs flu shot Today - Flu Vaccine Trivalent High Dose (Fluad)  2. Encounter for screening colonoscopy He is due last colonoscopy 5 years ago showed adenomas - Ambulatory referral to Gastroenterology  3. Encounter for subsequent annual wellness visit (AWV) in Medicare patient Adult wellness-complete.wellness physical was conducted today. Importance of diet and exercise were discussed in detail.  Importance of stress reduction and healthy living were discussed.  In addition to this a discussion regarding safety was also covered.  We also reviewed over immunizations and gave recommendations regarding current immunization needed for age.   In addition to this additional areas were also touched on including: Preventative health exams needed:  Colonoscopy  referral sent  Patient was advised yearly wellness exam   4. Well adult exam Adult wellness-complete.wellness physical was conducted today. Importance of diet and exercise were discussed in detail.  Importance of stress reduction and healthy living were discussed.  In addition to this a discussion regarding safety was also covered.  We also reviewed over immunizations and  gave recommendations regarding current immunization needed for age.   In addition to this additional areas were also touched on including: Preventative health exams needed:  Colonoscopy adenoma on previous 1 referral sent  Patient was advised yearly wellness exam   5. Other hyperlipidemia Check lab work patient does not tolerate statins  6. Primary hypertension HTN- patient seen for follow-up regarding HTN.   Diet, medication compliance, appropriate labs and refills were completed.   Importance of keeping blood pressure under good control to lessen the risk of complications discussed Regular follow-up visits discussed Blood pressure more elevated when he comes in the office but blood pressure log overall looks very good He should be noted that he did stop amlodipine his blood pressure readings actually look better no longer having low readings should he have trouble he will let us know 7. Benign prostatic hyperplasia with weak urinary stream Flomax helps some stick with this PSA looks good no indication for prostate exam today will recommend yearly PSA for another 1 or 2 years then he should stop doing that

## 2022-11-01 ENCOUNTER — Encounter (INDEPENDENT_AMBULATORY_CARE_PROVIDER_SITE_OTHER): Payer: Self-pay | Admitting: *Deleted

## 2022-11-03 DIAGNOSIS — L304 Erythema intertrigo: Secondary | ICD-10-CM | POA: Diagnosis not present

## 2022-11-03 DIAGNOSIS — Z1283 Encounter for screening for malignant neoplasm of skin: Secondary | ICD-10-CM | POA: Diagnosis not present

## 2022-11-03 DIAGNOSIS — D225 Melanocytic nevi of trunk: Secondary | ICD-10-CM | POA: Diagnosis not present

## 2022-11-03 DIAGNOSIS — L820 Inflamed seborrheic keratosis: Secondary | ICD-10-CM | POA: Diagnosis not present

## 2022-11-03 DIAGNOSIS — B078 Other viral warts: Secondary | ICD-10-CM | POA: Diagnosis not present

## 2022-11-15 ENCOUNTER — Ambulatory Visit: Payer: Medicare Other | Admitting: Family Medicine

## 2022-11-15 VITALS — BP 170/93 | HR 103 | Temp 98.3°F | Ht 64.75 in | Wt 207.0 lb

## 2022-11-15 DIAGNOSIS — R1030 Lower abdominal pain, unspecified: Secondary | ICD-10-CM | POA: Diagnosis not present

## 2022-11-15 LAB — POCT URINALYSIS DIP (CLINITEK)
Bilirubin, UA: NEGATIVE
Blood, UA: NEGATIVE
Glucose, UA: NEGATIVE mg/dL
Ketones, POC UA: NEGATIVE mg/dL
Leukocytes, UA: NEGATIVE
Nitrite, UA: NEGATIVE
POC PROTEIN,UA: NEGATIVE
Spec Grav, UA: 1.01 (ref 1.010–1.025)
Urobilinogen, UA: 0.2 U/dL
pH, UA: 6 (ref 5.0–8.0)

## 2022-11-15 NOTE — Progress Notes (Signed)
Subjective:  Patient ID: Joseph Rodgers, male    DOB: 1950-09-21  Age: 72 y.o. MRN: [id number]  CC: Chief Complaint  Patient presents with   pain around lower abdominal area     Pressure in prostate area last week, hx of prstatitis     HPI:  72 year old male presents for evaluation of the above.  Patient reports that last week he developed low back pain which went around to the abdomen. He states that this has now improved/resolved. However, he states that he had similar symptoms previously and he ended up having prostatitis. He is concerned about this today. No current urinary symptoms. No pelvic pain or rectal pain. No fever.   Patient Active Problem List   Diagnosis Date Noted   Benign heart murmur 10/27/2021   Aortic atherosclerosis (HCC) 04/26/2020   Atherosclerosis of coronary artery 10/25/2015   Gout 10/02/2013   OA (osteoarthritis) of knee 04/14/2011   Hyperlipidemia    Hypertension     Social Hx   Social History   Socioeconomic History   Marital status: Divorced    Spouse name: Not on file   Number of children: 0   Years of education: Not on file   Highest education level: Not on file  Occupational History   Occupation: Chartered certified accountant  Tobacco Use   Smoking status: Never   Smokeless tobacco: Never  Vaping Use   Vaping status: Never Used  Substance and Sexual Activity   Alcohol use: Not Currently    Comment: Occasional Beer, rare    Drug use: No   Sexual activity: Not on file  Other Topics Concern   Not on file  Social History Narrative   Not on file   Social Determinants of Health   Financial Resource Strain: Not on file  Food Insecurity: Not on file  Transportation Needs: Not on file  Physical Activity: Not on file  Stress: Not on file  Social Connections: Not on file    Review of Systems Per HPI  Objective:  BP (!) 170/93   Pulse (!) 103   Temp 98.3 F (36.8 C)   Ht 5' 4.75" (1.645 m)   Wt 207 lb (93.9 kg)   SpO2 96%   BMI 34.71  kg/m      11/15/2022    4:07 PM 10/31/2022    1:17 PM 10/31/2022    8:53 AM  BP/Weight  Systolic BP 170 138 159  Diastolic BP 93 78 78  Wt. (Lbs) 207  207.6  BMI 34.71 kg/m2  34.81 kg/m2    Physical Exam Vitals and nursing note reviewed.  Constitutional:      General: He is not in acute distress.    Appearance: Normal appearance.  HENT:     Head: Normocephalic and atraumatic.  Eyes:     General:        Right eye: No discharge.        Left eye: No discharge.     Conjunctiva/sclera: Conjunctivae normal.  Cardiovascular:     Rate and Rhythm: Normal rate and regular rhythm.     Heart sounds: Murmur heard.  Pulmonary:     Effort: Pulmonary effort is normal.     Breath sounds: Normal breath sounds.  Abdominal:     General: There is no distension.     Palpations: Abdomen is soft.     Tenderness: There is no abdominal tenderness.  Neurological:     Mental Status: He is alert.  Lab Results  Component Value Date   WBC 5.1 04/02/2016   HGB 16.7 04/02/2016   HCT 46.8 04/02/2016   PLT 153 04/02/2016   GLUCOSE 108 (H) 10/25/2022   CHOL 252 (H) 03/24/2022   TRIG 218 (H) 03/24/2022   HDL 43 03/24/2022   LDLCALC 168 (H) 03/24/2022   ALT 25 03/24/2022   AST 29 03/24/2022   NA 138 10/25/2022   K 4.9 10/25/2022   CL 98 10/25/2022   CREATININE 1.00 10/25/2022   BUN 13 10/25/2022   CO2 22 10/25/2022   PSA 1.22 09/21/2013   HGBA1C 5.9 (H) 10/25/2022   MICROALBUR 0.50 09/22/2012     Assessment & Plan:   Lower abdominal pain; history of prostatitis UA ordered and reviewed today. UA clear with no signs of infection. No current symptoms. History not consistent with prostatitis.  Well appearing on exam. Supportive care.   Follow-up:  Return if symptoms worsen or fail to improve.  Everlene Other DO [facility]

## 2022-11-15 NOTE — Patient Instructions (Signed)
If symptoms worsen or you have other concerns please let us know.  Take care  Dr. Adriana Simas

## 2022-11-16 ENCOUNTER — Ambulatory Visit: Payer: Medicare Other | Admitting: Family Medicine

## 2022-11-24 ENCOUNTER — Encounter: Payer: Self-pay | Admitting: *Deleted

## 2022-12-07 ENCOUNTER — Telehealth: Payer: Self-pay | Admitting: Internal Medicine

## 2022-12-07 NOTE — Telephone Encounter (Signed)
Questionnaire in review

## 2022-12-11 ENCOUNTER — Other Ambulatory Visit: Payer: Self-pay | Admitting: Family Medicine

## 2022-12-13 NOTE — Telephone Encounter (Signed)
  Procedure: Colonoscopy  Estimated body mass index is 34.71 kg/m as calculated from the following:   Height as of 11/15/22: 5' 4.75" (1.645 m).   Weight as of 11/15/22: 207 lb (93.9 kg).   Have you had a colonoscopy before?  12/13/17, Dr. Jena Gauss  Do you have family history of colon cancer?  no  Do you have a family history of polyps? no  Previous colonoscopy with polyps removed? yes  Do you have a history colorectal cancer?   no  Are you diabetic?  no  Do you have a prosthetic or mechanical heart valve? no  Do you have a pacemaker/defibrillator?   no  Have you had endocarditis/atrial fibrillation?  no  Do you use supplemental oxygen/CPAP?  no  Have you had joint replacement within the last 12 months?  no  Do you tend to be constipated or have to use laxatives?  no   Do you have history of alcohol use? If yes, how much and how often.  on  Do you have history or are you using drugs? If yes, what do are you  using?  no  Have you ever had a stroke/heart attack?  no  Have you ever had a heart or other vascular stent placed,?no  Do you take weight loss medication? no  Do you take any blood-thinning medications such as: (Plavix, aspirin, Coumadin, Aggrenox, Brilinta, Xarelto, Eliquis, Pradaxa, Savaysa or Effient)? no  If yes we need the name, milligram, dosage and who is prescribing doctor:               Current Outpatient Medications  Medication Sig Dispense Refill   acetaminophen (TYLENOL) 500 MG tablet Take 500 mg by mouth every 6 (six) hours as needed (for pain.).     cetirizine (ZYRTEC) 10 MG tablet Take 10 mg by mouth daily.     clobetasol cream (TEMOVATE) 0.05 % Apply topically 2 (two) times daily as needed.     lisinopril (ZESTRIL) 20 MG tablet Take 1 tablet (20 mg total) by mouth daily. 90 tablet 1   mometasone (ELOCON) 0.1 % cream Apply bid prn 45 g 12   naproxen sodium (ALEVE) 220 MG tablet Take 220 mg by mouth 2 (two) times daily as needed (for pain.).      Polyethyl Glycol-Propyl Glycol 0.4-0.3 % SOLN Place 1 drop into both eyes 3 (three) times daily as needed (for dry eyes.).     tamsulosin (FLOMAX) 0.4 MG CAPS capsule TAKE 1 CAPSULE BY MOUTH EVERY DAY 90 capsule 1   No current facility-administered medications for this visit.    Allergies  Allergen Reactions   Psyllium Hives   Red Yeast Rice [Cholestin]     myalgias   Sulfa Drugs Cross Reactors     Swelling in groin area   Simvastatin [Simvastatin]     myalgia

## 2022-12-14 ENCOUNTER — Other Ambulatory Visit: Payer: Self-pay | Admitting: *Deleted

## 2022-12-14 ENCOUNTER — Encounter: Payer: Self-pay | Admitting: *Deleted

## 2022-12-14 MED ORDER — PEG 3350-KCL-NA BICARB-NACL 420 G PO SOLR: 4000.0000 mL | Freq: Once | ORAL | 0 refills | Status: AC

## 2022-12-14 NOTE — Telephone Encounter (Signed)
Ok to schedule. ASA 2.  

## 2022-12-14 NOTE — Telephone Encounter (Signed)
Pt has been scheduled for 12/26/22, instructions mailed and prep sent to the pharmacy.

## 2022-12-15 NOTE — Telephone Encounter (Signed)
Questionnaire from recall, no referral needed  

## 2022-12-26 ENCOUNTER — Encounter (HOSPITAL_COMMUNITY): Payer: Self-pay | Admitting: Internal Medicine

## 2022-12-26 ENCOUNTER — Ambulatory Visit (HOSPITAL_COMMUNITY): Payer: Medicare Other | Admitting: Anesthesiology

## 2022-12-26 ENCOUNTER — Other Ambulatory Visit: Payer: Self-pay

## 2022-12-26 ENCOUNTER — Ambulatory Visit (HOSPITAL_COMMUNITY)
Admission: RE | Admit: 2022-12-26 | Discharge: 2022-12-26 | Disposition: A | Discharge status: Home / Self Care | Payer: Medicare Other | Attending: Internal Medicine | Admitting: Internal Medicine

## 2022-12-26 ENCOUNTER — Encounter (HOSPITAL_COMMUNITY)
Admission: RE | Disposition: A | Discharge status: Home / Self Care | Payer: Self-pay | Source: Home / Self Care | Attending: Internal Medicine

## 2022-12-26 DIAGNOSIS — I10 Essential (primary) hypertension: Secondary | ICD-10-CM | POA: Diagnosis not present

## 2022-12-26 DIAGNOSIS — D124 Benign neoplasm of descending colon: Secondary | ICD-10-CM | POA: Diagnosis not present

## 2022-12-26 DIAGNOSIS — Z09 Encounter for follow-up examination after completed treatment for conditions other than malignant neoplasm: Secondary | ICD-10-CM | POA: Diagnosis not present

## 2022-12-26 DIAGNOSIS — D126 Benign neoplasm of colon, unspecified: Secondary | ICD-10-CM | POA: Diagnosis not present

## 2022-12-26 DIAGNOSIS — Z1211 Encounter for screening for malignant neoplasm of colon: Secondary | ICD-10-CM

## 2022-12-26 DIAGNOSIS — K5730 Diverticulosis of large intestine without perforation or abscess without bleeding: Secondary | ICD-10-CM | POA: Diagnosis not present

## 2022-12-26 DIAGNOSIS — D122 Benign neoplasm of ascending colon: Secondary | ICD-10-CM

## 2022-12-26 DIAGNOSIS — I2510 Atherosclerotic heart disease of native coronary artery without angina pectoris: Secondary | ICD-10-CM | POA: Insufficient documentation

## 2022-12-26 DIAGNOSIS — Z860100 Personal history of colon polyps, unspecified: Secondary | ICD-10-CM

## 2022-12-26 DIAGNOSIS — Z860101 Personal history of adenomatous and serrated colon polyps: Secondary | ICD-10-CM | POA: Diagnosis not present

## 2022-12-26 HISTORY — PX: POLYPECTOMY: SHX5525

## 2022-12-26 HISTORY — PX: COLONOSCOPY WITH PROPOFOL: SHX5780

## 2022-12-26 SURGERY — COLONOSCOPY WITH PROPOFOL
Anesthesia: General

## 2022-12-26 MED ORDER — PROPOFOL 500 MG/50ML IV EMUL
INTRAVENOUS | Status: DC | PRN
  Administered 2022-12-26: 200 ug/kg/min via INTRAVENOUS
  Administered 2022-12-26: 100 mg via INTRAVENOUS

## 2022-12-26 MED ORDER — LACTATED RINGERS IV SOLN: INTRAVENOUS | Status: DC | PRN

## 2022-12-26 MED ORDER — STERILE WATER FOR IRRIGATION IR SOLN
Status: DC | PRN
  Administered 2022-12-26: 240 mL

## 2022-12-26 MED ORDER — PHENYLEPHRINE 80 MCG/ML (10ML) SYRINGE FOR IV PUSH (FOR BLOOD PRESSURE SUPPORT)
PREFILLED_SYRINGE | INTRAVENOUS | Status: DC | PRN
  Administered 2022-12-26: 80 ug via INTRAVENOUS
  Administered 2022-12-26: 160 ug via INTRAVENOUS
  Administered 2022-12-26 (×2): 80 ug via INTRAVENOUS

## 2022-12-26 NOTE — H&P (Signed)
@LOGO @   Primary Care Physician:  Babs Sciara, MD Primary Gastroenterologist:  Dr.   Pre-Procedure History & Physical: HPI:  Joseph Rodgers is a 72 y.o. male here for   Surveillance colonoscopy.  Multiple tubular adenomas removed from his colon 2019; here for surveillance examination.  Past Medical History:  Diagnosis Date   Allergic rhinitis    Aortic atherosclerosis (HCC) 04/26/2020   Seen on CT scan 2017   Coronary atherosclerosis of native coronary artery    Diverticulitis    occasional   Hyperlipidemia    Hypertension    On meds, runs higher at MD office   Kidney stones     Past Surgical History:  Procedure Laterality Date   APPENDECTOMY     colonoscopy  2005   Dr. Jena Gauss: normal rectum, left-sided transverse diverticulum, normal TI   COLONOSCOPY N/A 12/13/2017   Procedure: COLONOSCOPY;  Surgeon: Corbin Ade, MD;  Location: [facility];  Service: Endoscopy;  Laterality: N/A;  2:15pm   DOBUTAMINE STRESS ECHO  3/12   normal   POLYPECTOMY  12/13/2017   Procedure: POLYPECTOMY;  Surgeon: Corbin Ade, MD;  Location: [facility];  Service: Endoscopy;;  colon     Prior to Admission medications   Medication Sig Start Date End Date Taking? Authorizing Provider  acetaminophen (TYLENOL) 500 MG tablet Take 500 mg by mouth every 6 (six) hours as needed (for pain.).   Yes [provider]  cetirizine (ZYRTEC) 10 MG tablet Take 10 mg by mouth daily.   Yes [provider]  fluticasone (FLONASE) 50 MCG/ACT nasal spray Place into both nostrils as needed for allergies or rhinitis.   Yes [provider]  lisinopril (ZESTRIL) 20 MG tablet Take 1 tablet (20 mg total) by mouth daily. 10/31/22  Yes Babs Sciara, MD  mometasone (ELOCON) 0.1 % cream Apply bid prn 04/15/20  Yes Luking, Scott A, MD  naproxen sodium (ALEVE) 220 MG tablet Take 220 mg by mouth 2 (two) times daily as needed (for pain.).   Yes [provider]  Polyethyl Glycol-Propyl  Glycol 0.4-0.3 % SOLN Place 1 drop into both eyes 3 (three) times daily as needed (for dry eyes.).   Yes [provider]  tamsulosin (FLOMAX) 0.4 MG CAPS capsule TAKE 1 CAPSULE BY MOUTH EVERY DAY 10/18/22  Yes Luking, Scott A, MD  clobetasol cream (TEMOVATE) 0.05 % Apply topically 2 (two) times daily as needed. 06/17/21   [provider]    Allergies as of 12/14/2022 - Review Complete 12/13/2022  Allergen Reaction Noted   Psyllium Hives 12/05/2017   Red yeast rice [cholestin]  03/14/2017   Sulfa drugs cross reactors  05/07/2010   Simvastatin [simvastatin]  05/07/2010    Family History  Problem Relation Age of Onset   Diabetes Mother    Aneurysm Mother 61       [name]   Colon cancer Neg Hx    Colon polyps Neg Hx     Social History   Socioeconomic History   Marital status: Divorced    Spouse name: Not on file   Number of children: 0   Years of education: Not on file   Highest education level: Not on file  Occupational History   Occupation: Chartered certified accountant  Tobacco Use   Smoking status: Never   Smokeless tobacco: Never  Vaping Use   Vaping status: Never Used  Substance and Sexual Activity   Alcohol use: Not Currently    Comment: Occasional Beer,  rare    Drug use: No   Sexual activity: Not on file  Other Topics Concern   Not on file  Social History Narrative   Not on file   Social Determinants of Health   Financial Resource Strain: Not on file  Food Insecurity: Not on file  Transportation Needs: Not on file  Physical Activity: Not on file  Stress: Not on file  Social Connections: Not on file  Intimate Partner Violence: Not on file    Review of Systems: See HPI, otherwise negative ROS  Physical Exam: BP (!) 161/83   Pulse 100   Temp 98.2 F (36.8 C) (Oral)   Resp 20   Ht 5\' 7"  (1.702 m)   Wt 88.5 kg   SpO2 96%   BMI 30.54 kg/m  General:   Alert,  Well-developed, well-nourished, pleasant and cooperative in NAD Lungs:  Clear throughout to  auscultation.   No wheezes, crackles, or rhonchi. No acute distress. Heart:  Regular rate and rhythm; no murmurs, clicks, rubs,  or gallops. Abdomen: Non-distended, normal bowel sounds.  Soft and nontender without appreciable mass or hepatosplenomegaly.  Pulses:  Normal pulses noted. Extremities:  Without clubbing or edema.  Impression/Plan:    72 year old gentleman multiple colonic adenomas removed 5 years ago; here for surveillance colonoscopy per plan. The risks, benefits, limitations, alternatives and imponderables have been reviewed with the patient. Questions have been answered. All parties are agreeable.       Notice: This dictation was prepared with Dragon dictation along with smaller phrase technology. Any transcriptional errors that result from this process are unintentional and may not be corrected upon review.

## 2022-12-26 NOTE — Anesthesia Postprocedure Evaluation (Signed)
Anesthesia Post Note  Patient: Joseph Rodgers  Procedure(s) Performed: COLONOSCOPY WITH PROPOFOL POLYPECTOMY  Patient location during evaluation: PACU Anesthesia Type: General Level of consciousness: awake and alert Pain management: pain level controlled Vital Signs Assessment: post-procedure vital signs reviewed and stable Respiratory status: spontaneous breathing, nonlabored ventilation, respiratory function stable and patient connected to nasal cannula oxygen Cardiovascular status: blood pressure returned to baseline and stable Postop Assessment: no apparent nausea or vomiting Anesthetic complications: no   There were no known notable events for this encounter.   Last Vitals:  Vitals:   12/26/22 1042 12/26/22 1044  BP: (!) 91/44 (!) 97/52  Pulse: 86 87  Resp: (!) 21 (!) 21  Temp:    SpO2: 95% 96%    Last Pain:  Vitals:   12/26/22 1042  TempSrc:   PainSc: 0-No pain                 Orry Sigl L Drevon Plog

## 2022-12-26 NOTE — Transfer of Care (Signed)
Immediate Anesthesia Transfer of Care Note  Patient: Joseph Rodgers  Procedure(s) Performed: COLONOSCOPY WITH PROPOFOL POLYPECTOMY  Patient Location: Endoscopy Unit  Anesthesia Type:General  Level of Consciousness: drowsy  Airway & Oxygen Therapy: Patient Spontanous Breathing  Post-op Assessment: Report given to RN and Post -op Vital signs reviewed and stable  Post vital signs: Reviewed and stable  Last Vitals:  Vitals Value Taken Time  BP 85/36 12/26/22 1035  Temp 36.6 C 12/26/22 1035  Pulse 71 12/26/22 1035  Resp 17 12/26/22 1035  SpO2 95 % 12/26/22 1035    Last Pain:  Vitals:   12/26/22 1035  TempSrc: Oral  PainSc:       Patients Stated Pain Goal: 5 (12/26/22 0804)  Complications: No notable events documented.

## 2022-12-26 NOTE — Anesthesia Preprocedure Evaluation (Signed)
Anesthesia Evaluation  Patient identified by MRN, date of birth, ID band Patient awake    Reviewed: Allergy & Precautions, H&P , NPO status , Patient's Chart, lab work & pertinent test results, reviewed documented beta blocker date and time   Airway Mallampati: II  TM Distance: >3 FB Neck ROM: full    Dental no notable dental hx. (+) Dental Advisory Given, Teeth Intact   Pulmonary neg pulmonary ROS   Pulmonary exam normal breath sounds clear to auscultation       Cardiovascular Exercise Tolerance: Good hypertension, + CAD  Normal cardiovascular exam Rhythm:regular Rate:Normal     Neuro/Psych negative neurological ROS  negative psych ROS   GI/Hepatic negative GI ROS, Neg liver ROS,,,  Endo/Other  negative endocrine ROS    Renal/GU negative Renal ROS  negative genitourinary   Musculoskeletal   Abdominal   Peds  Hematology negative hematology ROS (+)   Anesthesia Other Findings   Reproductive/Obstetrics negative OB ROS                             Anesthesia Physical Anesthesia Plan  ASA: 3  Anesthesia Plan: General   Post-op Pain Management: Minimal or no pain anticipated   Induction: Intravenous  PONV Risk Score and Plan: Propofol infusion  Airway Management Planned: Nasal Cannula  Additional Equipment: None  Intra-op Plan:   Post-operative Plan:   Informed Consent: I have reviewed the patients History and Physical, chart, labs and discussed the procedure including the risks, benefits and alternatives for the proposed anesthesia with the patient or authorized representative who has indicated his/her understanding and acceptance.     Dental Advisory Given  Plan Discussed with: CRNA  Anesthesia Plan Comments:        Anesthesia Quick Evaluation

## 2022-12-26 NOTE — Op Note (Signed)
[facility] Patient Name: Joseph Rodgers Procedure Date: 12/26/2022 9:49 AM MRN: [id number] Date of Birth: 1950-10-14 Attending MD: Gennette Pac , MD, [id number] CSN: [id number] Age: 72 Admit Type: Outpatient Procedure:                Colonoscopy Indications:              High risk colon cancer surveillance: Personal                            history of colonic polyps Providers:                Gennette Pac, MD, Francoise Ceo RN, RN, Edrick Kins, RN, Dyann Ruddle Referring MD:              Medicines:                Propofol per Anesthesia Complications:            No immediate complications. Estimated Blood Loss:     Estimated blood loss was minimal. Procedure:                Pre-Anesthesia Assessment:                           - Prior to the procedure, a History and Physical                            was performed, and patient medications and                            allergies were reviewed. The patient's tolerance of                            previous anesthesia was also reviewed. The risks                            and benefits of the procedure and the sedation                            options and risks were discussed with the patient.                            All questions were answered, and informed consent                            was obtained. Prior Anticoagulants: The patient has                            taken no anticoagulant or antiplatelet agents. ASA                            Grade Assessment: II - A patient with mild systemic  disease. After reviewing the risks and benefits,                            the patient was deemed in satisfactory condition to                            undergo the procedure.                           After obtaining informed consent, the colonoscope                            was passed under direct vision. Throughout the                            procedure,  the patient's blood pressure, pulse, and                            oxygen saturations were monitored continuously. The                            [id number]) scope was introduced through the                            anus and advanced to the the cecum, identified by                            appendiceal orifice and ileocecal valve. The                            colonoscopy was performed without difficulty. The                            patient tolerated the procedure well. The quality                            of the bowel preparation was adequate. The                            ileocecal valve, appendiceal orifice, and rectum                            were photographed. The entire colon was well                            visualized. Scope In: 10:10:49 AM Scope Out: 10:31:42 AM Scope Withdrawal Time: 0 hours 17 minutes 46 seconds  Total Procedure Duration: 0 hours 20 minutes 53 seconds  Findings:      The perianal and digital rectal examinations were normal.      Five sessile polyps were found in the descending colon and ascending       colon. The polyps were 4 to 6 mm in size. These polyps were removed with       a cold snare. Resection and retrieval were complete. Estimated blood  loss was minimal.      Scattered small-mouthed diverticula were found in the entire colon.      The exam was otherwise without abnormality on direct and retroflexion       views. Impression:               - Five 4 to 6 mm polyps in the descending colon and                            in the ascending colon, removed with a cold snare.                            Resected and retrieved.                           - Diverticulosis in the entire examined colon.                           - The examination was otherwise normal on direct                            and retroflexion views. Moderate Sedation:      Moderate (conscious) sedation was personally administered by an       anesthesia  professional. The following parameters were monitored: oxygen       saturation, heart rate, blood pressure, respiratory rate, EKG, adequacy       of pulmonary ventilation, and response to care. Recommendation:           - Patient has a contact number available for                            emergencies. The signs and symptoms of potential                            delayed complications were discussed with the                            patient. Return to normal activities tomorrow.                            Written discharge instructions were provided to the                            patient.                           - Advance diet as tolerated.                           - Continue present medications.                           - Repeat colonoscopy date to be determined after                            pending pathology results are reviewed for  surveillance.                           - Return to GI office (date not yet determined). Procedure Code(s):        --- Professional ---                           [phone], Colonoscopy, flexible; with removal of                            tumor(s), polyp(s), or other lesion(s) by snare                            technique Diagnosis Code(s):        --- Professional ---                           Z86.010, Personal history of colonic polyps                           D12.4, Benign neoplasm of descending colon                           D12.2, Benign neoplasm of ascending colon                           K57.30, Diverticulosis of large intestine without                            perforation or abscess without bleeding CPT copyright 2022 American Medical Association. All rights reserved. The codes documented in this report are preliminary and upon coder review may  be revised to meet current compliance requirements. Gerrit Friends. Arnelle Nale, MD Gennette Pac, MD 12/26/2022 10:40:30 AM This report has been signed  electronically. Number of Addenda: 0

## 2022-12-26 NOTE — Discharge Instructions (Signed)
  Colonoscopy Discharge Instructions  Read the instructions outlined below and refer to this sheet in the next few weeks. These discharge instructions provide you with general information on caring for yourself after you leave the hospital. Your doctor may also give you specific instructions. While your treatment has been planned according to the most current medical practices available, unavoidable complications occasionally occur. If you have any problems or questions after discharge, call Dr. Jena Gauss at [phone]. ACTIVITY You may resume your regular activity, but move at a slower pace for the next 24 hours.  Take frequent rest periods for the next 24 hours.  Walking will help get rid of the air and reduce the bloated feeling in your belly (abdomen).  No driving for 24 hours (because of the medicine (anesthesia) used during the test).   Do not sign any important legal documents or operate any machinery for 24 hours (because of the anesthesia used during the test).  NUTRITION Drink plenty of fluids.  You may resume your normal diet as instructed by your doctor.  Begin with a light meal and progress to your normal diet. Heavy or fried foods are harder to digest and may make you feel sick to your stomach (nauseated).  Avoid alcoholic beverages for 24 hours or as instructed.  MEDICATIONS You may resume your normal medications unless your doctor tells you otherwise.  WHAT YOU CAN EXPECT TODAY Some feelings of bloating in the abdomen.  Passage of more gas than usual.  Spotting of blood in your stool or on the toilet paper.  IF YOU HAD POLYPS REMOVED DURING THE COLONOSCOPY: No aspirin products for 7 days or as instructed.  No alcohol for 7 days or as instructed.  Eat a soft diet for the next 24 hours.  FINDING OUT THE RESULTS OF YOUR TEST Not all test results are available during your visit. If your test results are not back during the visit, make an appointment with your caregiver to find out the  results. Do not assume everything is normal if you have not heard from your caregiver or the medical facility. It is important for you to follow up on all of your test results.  SEEK IMMEDIATE MEDICAL ATTENTION IF: You have more than a spotting of blood in your stool.  Your belly is swollen (abdominal distention).  You are nauseated or vomiting.  You have a temperature over 101.  You have abdominal pain or discomfort that is severe or gets worse throughout the day.       5 polyps removed from your colon today information on colon polyps and diverticulosis provided  Further recommendations to follow pending review of pathology report   at patient request, called Brett Canales at [phone]-reviewed findings and recommendations

## 2022-12-27 LAB — SURGICAL PATHOLOGY

## 2022-12-30 ENCOUNTER — Encounter (HOSPITAL_COMMUNITY): Payer: Self-pay | Admitting: Internal Medicine

## 2023-01-03 ENCOUNTER — Encounter: Payer: Self-pay | Admitting: Internal Medicine

## 2023-01-31 ENCOUNTER — Ambulatory Visit: Payer: Self-pay | Admitting: Family Medicine

## 2023-01-31 NOTE — Telephone Encounter (Signed)
Copied from CRM [id number]. Topic: Clinical - Red Word Triage >> Jan 31, 2023 11:01 AM Prudencio Pair wrote: Red Word that prompted transfer to Nurse Triage: Patient states he has numbness in both feet and this has been ongoing for about 2 weeks.    Chief Complaint: Numbness Symptoms: numbness in toes of both feet Frequency: constant x 2 weeks Pertinent Negatives: Patient denies cardiac symptoms, blurred vision, and  Disposition: [] ED /[] Urgent Care (no appt availability in office) / [x] Appointment(In office/virtual)/ []  [facility]/ [] Home Care/ [] Refused Recommended Disposition /[] [facility]/ []  Follow-up with PCP Additional Notes: Patient experience numbness in both feet x 2weeks. States that he feels it more wih colder weather. RN advised pt to be see within 3 days. Pt adamant about seeing Dr.Luking only, pt aware that next available appt is 02/23/23 with Dr. Gerda Diss. Pt placed on waitlist for earlier appts. RN advised pt to go to urgent care or call back if symptoms worsened.    Reason for Disposition  [1] Numbness or tingling on both sides of body AND [2] is a new symptom present > 24 hours  Answer Assessment - Initial Assessment Questions 1. SYMPTOM: "What is the main symptom you are concerned about?" (e.g., weakness, numbness)     Numbness in toes on both feet  2. ONSET: "When did this start?" (minutes, hours, days; while sleeping)     States that this has been going on for month  3. LAST NORMAL: "When was the last time you (the patient) were normal (no symptoms)?"     ""I honestly don't know"  4. PATTERN "Does this come and go, or has it been constant since it started?"  "Is it present now?"     States getting worse with colder month  5. CARDIAC SYMPTOMS: "Have you had any of the following symptoms: chest pain, difficulty breathing, palpitations?"     No Cardiac symptoms  6. NEUROLOGIC SYMPTOMS: "Have you had any of the following symptoms: headache,  dizziness, vision loss, double vision, changes in speech, unsteady on your feet?"     No Neurologic symptoms  7. OTHER SYMPTOMS: "Do you have any other symptoms?"     No  Protocols used: Neurologic Deficit-A-AH

## 2023-02-23 ENCOUNTER — Ambulatory Visit (INDEPENDENT_AMBULATORY_CARE_PROVIDER_SITE_OTHER): Payer: Medicare Other | Admitting: Family Medicine

## 2023-02-23 VITALS — BP 158/78 | HR 103 | Temp 98.2°F | Ht 67.0 in | Wt 210.0 lb

## 2023-02-23 DIAGNOSIS — E7849 Other hyperlipidemia: Secondary | ICD-10-CM

## 2023-02-23 DIAGNOSIS — I10 Essential (primary) hypertension: Secondary | ICD-10-CM

## 2023-02-23 NOTE — Progress Notes (Signed)
   Subjective:    Patient ID: Joseph Rodgers, male    DOB: 1950-08-16, 73 y.o.   MRN: [id number]  HPI Subjective discomfort in his toes as well as a feeling of numbness denies any pins needles denies burning bothers him more when he has issues on not as much at night Denies calf pain or tenderness with walking.  Denies foot pain or swelling.  Patient has history hypertension hyperlipidemia as well as prediabetes  Review of Systems     Objective:   Physical Exam General-in no acute distress Eyes-no discharge Lungs-respiratory rate normal, CTA CV-no murmurs,RRR Extremities skin warm dry no edema Neuro grossly normal Behavior normal, alert  Diabetic foot exam normal bilateral       Assessment & Plan:  Subjective discomfort in his toes no finding of peripheral neuropathy monofilament testing normal pulses are normal reassurance given recommend shoes that have adequate toebox room  Patient to do blood work before follow-up visit and also continue his medication.  Patient in the past not tolerant of statins

## 2023-03-10 ENCOUNTER — Other Ambulatory Visit: Payer: Self-pay | Admitting: Family Medicine

## 2023-04-08 ENCOUNTER — Other Ambulatory Visit: Payer: Self-pay | Admitting: Family Medicine

## 2023-04-26 DIAGNOSIS — I10 Essential (primary) hypertension: Secondary | ICD-10-CM | POA: Diagnosis not present

## 2023-04-26 DIAGNOSIS — E7849 Other hyperlipidemia: Secondary | ICD-10-CM | POA: Diagnosis not present

## 2023-04-26 DIAGNOSIS — H43393 Other vitreous opacities, bilateral: Secondary | ICD-10-CM | POA: Diagnosis not present

## 2023-04-28 LAB — MICROALBUMIN / CREATININE URINE RATIO
Creatinine, Urine: 108 mg/dL
Microalb/Creat Ratio: 21 mg/g{creat} (ref 0–29)
Microalbumin, Urine: 23 ug/mL

## 2023-04-28 LAB — BASIC METABOLIC PANEL
BUN/Creatinine Ratio: 16 (ref 10–24)
BUN: 19 mg/dL (ref 8–27)
CO2: 22 mmol/L (ref 20–29)
Calcium: 10 mg/dL (ref 8.6–10.2)
Chloride: 101 mmol/L (ref 96–106)
Creatinine, Ser: 1.18 mg/dL (ref 0.76–1.27)
Glucose: 105 mg/dL — ABNORMAL HIGH (ref 70–99)
Potassium: 4.9 mmol/L (ref 3.5–5.2)
Sodium: 143 mmol/L (ref 134–144)
eGFR: 65 mL/min/{1.73_m2} (ref >59)

## 2023-04-28 LAB — LIPID PANEL
Chol/HDL Ratio: 6.1 ratio — ABNORMAL HIGH (ref 0.0–5.0)
Cholesterol, Total: 256 mg/dL — ABNORMAL HIGH (ref 100–199)
HDL: 42 mg/dL (ref >39)
LDL Chol Calc (NIH): 170 mg/dL — ABNORMAL HIGH (ref 0–99)
Triglycerides: 232 mg/dL — ABNORMAL HIGH (ref 0–149)
VLDL Cholesterol Cal: 44 mg/dL — ABNORMAL HIGH (ref 5–40)

## 2023-05-01 ENCOUNTER — Ambulatory Visit: Payer: Medicare Other | Admitting: Family Medicine

## 2023-05-03 ENCOUNTER — Ambulatory Visit: Payer: Medicare Other | Admitting: Family Medicine

## 2023-05-03 ENCOUNTER — Encounter: Payer: Self-pay | Admitting: Family Medicine

## 2023-05-08 ENCOUNTER — Telehealth: Payer: Self-pay | Admitting: Family Medicine

## 2023-05-09 NOTE — Telephone Encounter (Signed)
 I filled out the death certificate electronically as requested I was able to speak with his brother regarding his passing It is hard to know exactly why he died but more than likely cardiovascular disease with heart attack Feel free to let the funeral home know that we have finished the death certificate Please forward to me a sympathy card Thank you-Dr. Lorin Picket

## 2023-05-16 NOTE — Telephone Encounter (Unsigned)
 Copied from CRM [id number]. Topic: General - Deceased Patient >> 05-30-23  3:58 PM Alessandra Bevels wrote: Name of caller: Delice Bison  Date of death: Found May 30, 2023  Name of funeral home: [facility], [place]  Phone number of funeral home: [phone]  Provider that needs to sign form: Dr. Gerda Diss   Timeline for signing: Needs to be cremated June 03, 2023. Can the death certificate be signed tomorrow. Has already been sent through [facility]

## 2023-05-16 DEATH — deceased
